# Patient Record
Sex: Female | Born: 1975 | Race: Black or African American | Hispanic: No | Marital: Married | Smoking: Never smoker
Health system: Southern US, Community
[De-identification: ages and names within clinical notes are randomized; demographics above are authoritative.]

## PROBLEM LIST (undated history)

## (undated) DIAGNOSIS — D649 Anemia, unspecified: Secondary | ICD-10-CM

## (undated) DIAGNOSIS — Z789 Other specified health status: Secondary | ICD-10-CM

## (undated) DIAGNOSIS — K219 Gastro-esophageal reflux disease without esophagitis: Secondary | ICD-10-CM

---

## 1997-05-06 HISTORY — PX: PILONIDAL CYST EXCISION: SHX744

## 1999-10-04 ENCOUNTER — Other Ambulatory Visit: Admission: RE | Admit: 1999-10-04 | Discharge: 1999-10-04 | Payer: Self-pay | Admitting: Obstetrics and Gynecology

## 2000-07-30 ENCOUNTER — Emergency Department (HOSPITAL_COMMUNITY): Admission: EM | Admit: 2000-07-30 | Discharge: 2000-07-30 | Payer: Self-pay | Admitting: *Deleted

## 2000-08-21 ENCOUNTER — Other Ambulatory Visit: Admission: RE | Admit: 2000-08-21 | Discharge: 2000-08-21 | Payer: Self-pay | Admitting: Obstetrics and Gynecology

## 2018-01-11 ENCOUNTER — Emergency Department (HOSPITAL_COMMUNITY): Payer: BLUE CROSS/BLUE SHIELD

## 2018-01-11 ENCOUNTER — Other Ambulatory Visit: Payer: Self-pay

## 2018-01-11 ENCOUNTER — Inpatient Hospital Stay (HOSPITAL_COMMUNITY)
Admission: EM | Admit: 2018-01-11 | Discharge: 2018-01-12 | DRG: 281 | Disposition: A | Discharge status: Home / Self Care | Payer: BLUE CROSS/BLUE SHIELD | Attending: Cardiology | Admitting: Cardiology

## 2018-01-11 ENCOUNTER — Encounter (HOSPITAL_COMMUNITY): Payer: Self-pay

## 2018-01-11 DIAGNOSIS — R55 Syncope and collapse: Secondary | ICD-10-CM

## 2018-01-11 DIAGNOSIS — G9389 Other specified disorders of brain: Secondary | ICD-10-CM | POA: Diagnosis not present

## 2018-01-11 DIAGNOSIS — I4510 Unspecified right bundle-branch block: Secondary | ICD-10-CM | POA: Diagnosis not present

## 2018-01-11 DIAGNOSIS — R0789 Other chest pain: Secondary | ICD-10-CM | POA: Diagnosis not present

## 2018-01-11 DIAGNOSIS — N920 Excessive and frequent menstruation with regular cycle: Secondary | ICD-10-CM | POA: Diagnosis present

## 2018-01-11 DIAGNOSIS — R072 Precordial pain: Secondary | ICD-10-CM | POA: Diagnosis not present

## 2018-01-11 DIAGNOSIS — D509 Iron deficiency anemia, unspecified: Secondary | ICD-10-CM | POA: Diagnosis not present

## 2018-01-11 DIAGNOSIS — G4489 Other headache syndrome: Secondary | ICD-10-CM | POA: Diagnosis not present

## 2018-01-11 DIAGNOSIS — R079 Chest pain, unspecified: Secondary | ICD-10-CM | POA: Diagnosis not present

## 2018-01-11 DIAGNOSIS — Z6841 Body Mass Index (BMI) 40.0 and over, adult: Secondary | ICD-10-CM | POA: Diagnosis not present

## 2018-01-11 DIAGNOSIS — I214 Non-ST elevation (NSTEMI) myocardial infarction: Secondary | ICD-10-CM | POA: Diagnosis not present

## 2018-01-11 DIAGNOSIS — I209 Angina pectoris, unspecified: Secondary | ICD-10-CM

## 2018-01-11 HISTORY — DX: Other specified health status: Z78.9

## 2018-01-11 HISTORY — DX: Angina pectoris, unspecified: I20.9

## 2018-01-11 LAB — COMPREHENSIVE METABOLIC PANEL
ALBUMIN: 3.4 g/dL — AB (ref 3.5–5.0)
ALT: 21 U/L (ref 0–44)
AST: 31 U/L (ref 15–41)
Alkaline Phosphatase: 61 U/L (ref 38–126)
Anion gap: 9 (ref 5–15)
BILIRUBIN TOTAL: 0.5 mg/dL (ref 0.3–1.2)
BUN: 10 mg/dL (ref 6–20)
CO2: 22 mmol/L (ref 22–32)
Calcium: 8.7 mg/dL — ABNORMAL LOW (ref 8.9–10.3)
Chloride: 107 mmol/L (ref 98–111)
Creatinine, Ser: 0.87 mg/dL (ref 0.44–1.00)
GFR calc Af Amer: >60 mL/min (ref >60)
GFR calc non Af Amer: >60 mL/min (ref >60)
Glucose, Bld: 128 mg/dL — ABNORMAL HIGH (ref 70–99)
POTASSIUM: 3.4 mmol/L — AB (ref 3.5–5.1)
Sodium: 138 mmol/L (ref 135–145)
TOTAL PROTEIN: 7 g/dL (ref 6.5–8.1)

## 2018-01-11 LAB — TROPONIN I: Troponin I: <0.03 ng/mL (ref <0.03)

## 2018-01-11 LAB — HEPARIN LEVEL (UNFRACTIONATED)
Heparin Unfractionated: <0.1 IU/mL — ABNORMAL LOW (ref 0.30–0.70)
Heparin Unfractionated: 0.12 IU/mL — ABNORMAL LOW (ref 0.30–0.70)

## 2018-01-11 LAB — CBC
HCT: 33.8 % — ABNORMAL LOW (ref 36.0–46.0)
Hemoglobin: 9.9 g/dL — ABNORMAL LOW (ref 12.0–15.0)
MCH: 23 pg — AB (ref 26.0–34.0)
MCHC: 29.3 g/dL — AB (ref 30.0–36.0)
MCV: 78.4 fL (ref 78.0–100.0)
PLATELETS: 262 10*3/uL (ref 150–400)
RBC: 4.31 MIL/uL (ref 3.87–5.11)
RDW: 14.7 % (ref 11.5–15.5)
WBC: 9.7 10*3/uL (ref 4.0–10.5)

## 2018-01-11 LAB — RAPID URINE DRUG SCREEN, HOSP PERFORMED
Amphetamines: NOT DETECTED
Barbiturates: NOT DETECTED
Benzodiazepines: NOT DETECTED
COCAINE: NOT DETECTED
Opiates: NOT DETECTED
Tetrahydrocannabinol: NOT DETECTED

## 2018-01-11 LAB — POC OCCULT BLOOD, ED: Fecal Occult Bld: NEGATIVE

## 2018-01-11 LAB — I-STAT TROPONIN, ED: Troponin i, poc: 0.13 ng/mL (ref 0.00–0.08)

## 2018-01-11 LAB — HEMOGLOBIN A1C
HEMOGLOBIN A1C: 5.8 % — AB (ref 4.8–5.6)
Mean Plasma Glucose: 119.76 mg/dL

## 2018-01-11 LAB — I-STAT BETA HCG BLOOD, ED (MC, WL, AP ONLY): I-stat hCG, quantitative: <5 m[IU]/mL (ref <5)

## 2018-01-11 MED ORDER — SODIUM CHLORIDE 0.9% FLUSH: 3.0000 mL | INTRAVENOUS | Status: DC | PRN

## 2018-01-11 MED ORDER — ROSUVASTATIN CALCIUM 20 MG PO TABS
20.0000 mg | ORAL_TABLET | Freq: Every day | ORAL | Status: DC
  Administered 2018-01-11: 20 mg via ORAL
  Filled 2018-01-11: qty 1

## 2018-01-11 MED ORDER — IOPAMIDOL (ISOVUE-370) INJECTION 76%
INTRAVENOUS | Status: AC
  Administered 2018-01-11: 100 mL
  Filled 2018-01-11: qty 100

## 2018-01-11 MED ORDER — HEPARIN BOLUS VIA INFUSION
2500.0000 [IU] | Freq: Once | INTRAVENOUS | Status: AC
  Administered 2018-01-11: 2500 [IU] via INTRAVENOUS
  Filled 2018-01-11: qty 2500

## 2018-01-11 MED ORDER — ASPIRIN EC 81 MG PO TBEC: 81.0000 mg | DELAYED_RELEASE_TABLET | Freq: Every day | ORAL | Status: DC

## 2018-01-11 MED ORDER — SODIUM CHLORIDE 0.9 % IV SOLN
INTRAVENOUS | Status: DC
  Administered 2018-01-11 – 2018-01-12 (×2): via INTRAVENOUS

## 2018-01-11 MED ORDER — ACETAMINOPHEN 325 MG PO TABS
650.0000 mg | ORAL_TABLET | ORAL | Status: DC | PRN
  Administered 2018-01-11: 650 mg via ORAL
  Filled 2018-01-11: qty 2

## 2018-01-11 MED ORDER — CLOPIDOGREL BISULFATE 75 MG PO TABS
75.0000 mg | ORAL_TABLET | Freq: Every day | ORAL | Status: DC
  Administered 2018-01-12: 75 mg via ORAL
  Filled 2018-01-11: qty 1

## 2018-01-11 MED ORDER — SODIUM CHLORIDE 0.9 % IV SOLN: 250.0000 mL | INTRAVENOUS | Status: DC | PRN

## 2018-01-11 MED ORDER — SODIUM CHLORIDE 0.9% FLUSH: 3.0000 mL | Freq: Two times a day (BID) | INTRAVENOUS | Status: DC

## 2018-01-11 MED ORDER — CLOPIDOGREL BISULFATE 75 MG PO TABS
600.0000 mg | ORAL_TABLET | Freq: Once | ORAL | Status: AC
  Administered 2018-01-11: 600 mg via ORAL
  Filled 2018-01-11: qty 8

## 2018-01-11 MED ORDER — HEPARIN (PORCINE) IN NACL 100-0.45 UNIT/ML-% IJ SOLN
1900.0000 [IU]/h | INTRAMUSCULAR | Status: DC
  Administered 2018-01-11: 1800 [IU]/h via INTRAVENOUS
  Administered 2018-01-11: 1250 [IU]/h via INTRAVENOUS
  Filled 2018-01-11 (×2): qty 250

## 2018-01-11 MED ORDER — ASPIRIN 81 MG PO CHEW
324.0000 mg | CHEWABLE_TABLET | ORAL | Status: DC
  Filled 2018-01-11: qty 4

## 2018-01-11 MED ORDER — ASPIRIN 300 MG RE SUPP: 300.0000 mg | RECTAL | Status: DC

## 2018-01-11 MED ORDER — ONDANSETRON HCL 4 MG/2ML IJ SOLN: 4.0000 mg | Freq: Four times a day (QID) | INTRAMUSCULAR | Status: DC | PRN

## 2018-01-11 MED ORDER — HEPARIN BOLUS VIA INFUSION
4000.0000 [IU] | Freq: Once | INTRAVENOUS | Status: AC
  Administered 2018-01-11: 4000 [IU] via INTRAVENOUS
  Filled 2018-01-11: qty 4000

## 2018-01-11 MED ORDER — NITROGLYCERIN 0.4 MG SL SUBL: 0.4000 mg | SUBLINGUAL_TABLET | SUBLINGUAL | Status: DC | PRN

## 2018-01-11 MED ORDER — METOPROLOL TARTRATE 12.5 MG HALF TABLET
12.5000 mg | ORAL_TABLET | Freq: Two times a day (BID) | ORAL | Status: DC
  Administered 2018-01-11 – 2018-01-12 (×2): 12.5 mg via ORAL
  Filled 2018-01-11 (×2): qty 1

## 2018-01-11 NOTE — Progress Notes (Signed)
ANTICOAGULATION CONSULT NOTE - Initial Consult  Pharmacy Consult for heparin Indication: chest pain/ACS  No Known Allergies  Patient Measurements: Height: 5\' 5"  (165.1 cm) Weight: 300 lb (136.1 kg) IBW/kg (Calculated) : 57 Heparin Dosing Weight: 90.7 kg  Vital Signs: Temp: 97.4 F (36.3 C) (09/08 0251) Temp Source: Oral (09/08 0251) BP: 129/80 (09/08 0530) Pulse Rate: 74 (09/08 0530)  Labs: Recent Labs    01/11/18 0318  HGB 9.9*  HCT 33.8*  PLT 262  CREATININE 0.87    Estimated Creatinine Clearance: 117.8 mL/min (by C-G formula based on SCr of 0.87 mg/dL).   Medical History: Past Medical History:  Diagnosis Date  . Known health problems: none     Medications:  See medication history  Assessment: 42 yo lady to start heparin for CP.  She was not on anticoagulation PTA.  Initial Hg low at 9.9, PTLC 262 Goal of Therapy:  Heparin level 0.3-0.7 units/ml Monitor platelets by anticoagulation protocol: Yes   Plan:  Heparin bolus 4000 units and drip at 1250 units/hr Check heparin level 6 hours after start Daily HL and CBC while on heparin  Nemesio Castrillon Poteet 01/11/2018,5:55 AM

## 2018-01-11 NOTE — Progress Notes (Signed)
[facility] for heparin Indication: chest pain/ACS  No Known Allergies  Patient Measurements: Height: 5\' 5"  (165.1 cm) Weight: 300 lb (136.1 kg) IBW/kg (Calculated) : 57 Heparin Dosing Weight: 90.7 kg  Vital Signs: Temp: 98.6 F (37 C) (09/08 1426) Temp Source: Oral (09/08 1426) BP: 123/86 (09/08 1426) Pulse Rate: 79 (09/08 1426)  Labs: Recent Labs    01/11/18 0318 01/11/18 1201 01/11/18 1356  HGB 9.9*  --   --   HCT 33.8*  --   --   PLT 262  --   --   HEPARINUNFRC  --   --  <0.10*  CREATININE 0.87  --   --   TROPONINI  --  <0.03  --     Estimated Creatinine Clearance: 117.8 mL/min (by C-G formula based on SCr of 0.87 mg/dL).   Medical History: Past Medical History:  Diagnosis Date  . Known health problems: none     Medications:  See medication history  Assessment: 42 yo lady to start heparin for CP.  She was not on anticoagulation PTA. Hg low at 9.9, PTLC 262  Initial heparin level undetectable, no overt bleeding or infusion issues reported   Goal of Therapy:  Heparin level 0.3-0.7 units/ml Monitor platelets by anticoagulation protocol: Yes   Plan:  Bolus heparin 2500 units  Increase heparin gtt to 1500 units/hr Check heparin level 6 hours after start Daily HL and CBC while on heparin  Marissa Flores Aleister Lady 01/11/2018,2:39 PM

## 2018-01-11 NOTE — ED Notes (Addendum)
Patient transported to CT/xray 

## 2018-01-11 NOTE — ED Provider Notes (Signed)
[facility] EMERGENCY DEPARTMENT Provider Note  CSN: [id number] Arrival date & time: 01/11/18 0093  Chief Complaint(s) Chest Pain and Loss of Consciousness  HPI Marissa Flores is a 42 y.o. female with a history of obesity presents to the emergency department with sudden onset of severe sharp precordial chest pain that wrapped around the entire torso approximately 1-1/2 hours ago.  Pain was at rest.  No alleviating or aggravating factors.  Resolve spontaneously within 30 minutes.  This was associated with shortness of breath.  Also with syncopal episode resulting and possible head trauma.  Patient reports that she was sleeping when the pain began.  She sat up on the side of the bed and asked her son to bring her glass of milk.  After drinking the milk patient reports waking up on the floor.  States that she hit her head or shoulder on the wall creating a hole in the wall.  Currently she denies any chest pain or shortness of breath.  She denies any prior cardiac history.  Denies any prior DVT/PEs.  Denies any recent travel, personal history of cancer, OCP or hormone replacement therapy.  Endorses mild headache.  No neck pain, back pain, shoulder pain.  Denies any other physical injuries.  HPI  Past Medical History Past Medical History:  Diagnosis Date  . Known health problems: none    There are no active problems to display for this patient.  Home Medication(s) Prior to Admission medications   Medication Sig Start Date End Date Taking? Authorizing Provider  ibuprofen (ADVIL,MOTRIN) 200 MG tablet Take 600-800 mg by mouth every 6 (six) hours as needed for mild pain.   Yes [provider]                                                                                                                                    Past Surgical History History reviewed. No pertinent surgical history. Family History History reviewed. No pertinent family history.  Social  History Social History   Tobacco Use  . Smoking status: Never Smoker  . Smokeless tobacco: Never Used  Substance Use Topics  . Alcohol use: Yes    Comment: socially  . Drug use: Never   Allergies Patient has no known allergies.  Review of Systems Review of Systems All other systems are reviewed and are negative for acute change except as noted in the HPI  Physical Exam Vital Signs  I have reviewed the triage vital signs BP 116/70 (BP Location: Left Arm)   Pulse 85   Temp (!) 97.4 F (36.3 C) (Oral)   Resp 20   Ht 5\' 5"  (1.651 m)   Wt 136.1 kg   LMP 01/04/2018   SpO2 100%   BMI 49.92 kg/m   Physical Exam  Constitutional: She is oriented to person, place, and time. She appears well-developed and well-nourished. No distress.  Morbidly obese  HENT:  Head:  Normocephalic and atraumatic.  Right Ear: External ear normal.  Left Ear: External ear normal.  Nose: Nose normal.  Eyes: Pupils are equal, round, and reactive to light. Conjunctivae and EOM are normal. Right eye exhibits no discharge. Left eye exhibits no discharge. No scleral icterus.  Neck: Normal range of motion. Neck supple.  Cardiovascular: Normal rate, regular rhythm and normal heart sounds. Exam reveals no gallop and no friction rub.  No murmur heard. Pulses:      Radial pulses are 2+ on the right side, and 2+ on the left side.       Dorsalis pedis pulses are 2+ on the right side, and 2+ on the left side.  Pulmonary/Chest: Effort normal and breath sounds normal. No stridor. No respiratory distress. She has no wheezes. She has no rales.  Abdominal: Soft. She exhibits no distension. There is no tenderness.  Musculoskeletal: She exhibits no edema or tenderness.       Cervical back: She exhibits no bony tenderness.       Thoracic back: She exhibits no bony tenderness.       Lumbar back: She exhibits no bony tenderness.  Clavicles stable. Chest stable to AP/Lat compression. Pelvis stable to Lat compression. No  obvious extremity deformity. No chest or abdominal wall contusion.  Neurological: She is alert and oriented to person, place, and time.  Moving all extremities  Skin: Skin is warm and dry. No rash noted. She is not diaphoretic. No erythema.  Psychiatric: She has a normal mood and affect.  Vitals reviewed.   ED Results and Treatments Labs (all labs ordered are listed, but only abnormal results are displayed) Labs Reviewed  CBC - Abnormal; Notable for the following components:      Result Value   Hemoglobin 9.9 (*)    HCT 33.8 (*)    MCH 23.0 (*)    MCHC 29.3 (*)    All other components within normal limits  COMPREHENSIVE METABOLIC PANEL - Abnormal; Notable for the following components:   Potassium 3.4 (*)    Glucose, Bld 128 (*)    Calcium 8.7 (*)    Albumin 3.4 (*)    All other components within normal limits  I-STAT TROPONIN, ED - Abnormal; Notable for the following components:   Troponin i, poc 0.13 (*)    All other components within normal limits  HEPARIN LEVEL (UNFRACTIONATED)  RAPID URINE DRUG SCREEN, HOSP PERFORMED  I-STAT BETA HCG BLOOD, ED ([facility], [facility], [facility] ONLY)  POC OCCULT BLOOD, ED                                                                                                                         EKG  EKG Interpretation  Date/Time:  Sunday January 11 2018 03:02:04 EDT Ventricular Rate:  86 PR Interval:    QRS Duration: 146 QT Interval:  400 QTC Calculation: 479 R Axis:   45 Text Interpretation:  Sinus rhythm Right bundle branch block NO STEMI No old  tracing to compare Confirmed by Addison Lank [phone]) on 01/11/2018 3:08:22 AM      Radiology Dg Chest 2 View  Result Date: 01/11/2018 CLINICAL DATA:  Chest pain EXAM: CHEST - 2 VIEW COMPARISON:  None. FINDINGS: The heart size and mediastinal contours are within normal limits. Both lungs are clear. The visualized skeletal structures are unremarkable. IMPRESSION: No active cardiopulmonary disease. Electronically  Signed   By: Ulyses Jarred M.D.   On: 01/11/2018 04:22   Ct Head Wo Contrast  Result Date: 01/11/2018 CLINICAL DATA:  Patient woke at 0130 hours with severe chest pain and then passed out. EXAM: CT HEAD WITHOUT CONTRAST TECHNIQUE: Contiguous axial images were obtained from the base of the skull through the vertex without intravenous contrast. COMPARISON:  None. FINDINGS: Brain: No evidence of acute infarction, hemorrhage, hydrocephalus, extra-axial collection or mass lesion/mass effect. Focal basal ganglia calcification on the left. Vascular: No hyperdense vessel or unexpected calcification. Skull: Normal. Negative for fracture or focal lesion. Sinuses/Orbits: No acute finding. Other: None. IMPRESSION: No acute intracranial abnormality. Electronically Signed   By: Lucienne Capers M.D.   On: 01/11/2018 05:08   Ct Angio Chest Pe W And/or Wo Contrast  Result Date: 01/11/2018 CLINICAL DATA:  Patient woke at 1:30 a.m. with severe chest pain and then passed out. EXAM: CT ANGIOGRAPHY CHEST WITH CONTRAST TECHNIQUE: Multidetector CT imaging of the chest was performed using the standard protocol during bolus administration of intravenous contrast. Multiplanar CT image reconstructions and MIPs were obtained to evaluate the vascular anatomy. CONTRAST:  149mL ISOVUE-370 IOPAMIDOL (ISOVUE-370) INJECTION 76% COMPARISON:  None. FINDINGS: Cardiovascular: Moderately good opacification of the central and segmental pulmonary arteries. No focal filling defects. No evidence of significant pulmonary embolus. Normal heart size. No pericardial effusion. Normal caliber thoracic aorta. Great vessel origins are patent. Mediastinum/Nodes: No significant lymphadenopathy in the chest. Esophagus is decompressed. Lungs/Pleura: Motion artifact limits evaluation. Lungs appear clear and expanded. No pleural effusions. No pneumothorax. Airways are patent. Upper Abdomen: No acute abnormalities identified. Musculoskeletal: No chest wall  abnormality. No acute or significant osseous findings. Review of the MIP images confirms the above findings. IMPRESSION: No evidence of significant pulmonary embolus. No evidence of active pulmonary disease. Electronically Signed   By: Lucienne Capers M.D.   On: 01/11/2018 05:10   Pertinent labs & imaging results that were available during my care of the patient were reviewed by me and considered in my medical decision making (see chart for details).  Medications Ordered in ED Medications  heparin ADULT infusion 100 units/mL (25000 units/228mL sodium chloride 0.45%) (1,250 Units/hr Intravenous New Bag/Given 01/11/18 0645)  iopamidol (ISOVUE-370) 76 % injection (100 mLs  Contrast Given 01/11/18 0437)  heparin bolus via infusion 4,000 Units (4,000 Units Intravenous Bolus from Bag 01/11/18 0645)  Procedures Procedures CRITICAL CARE Performed by: Grayce Sessions Wentworth Edelen Total critical care time: 55 minutes Critical care time was exclusive of separately billable procedures and treating other patients. Critical care was necessary to treat or prevent imminent or life-threatening deterioration. Critical care was time spent personally by me on the following activities: development of treatment plan with patient and/or surrogate as well as nursing, discussions with consultants, evaluation of patient's response to treatment, examination of patient, obtaining history from patient or surrogate, ordering and performing treatments and interventions, ordering and review of laboratory studies, ordering and review of radiographic studies, pulse oximetry and re-evaluation of patient's condition.   (including critical care time)  Medical Decision Making / ED Course I have reviewed the nursing notes for this encounter and the patient's prior records (if available in EHR or on provided  paperwork).    Chest pain with shortness of breath and syncopal episode.  EKG with right bundle branch block otherwise no ischemic changes.  No prior EKG for comparison.  Initial troponin +0.13.  Presentation initially was most concerning for pulmonary embolism however CTA was negative for PE.  Rest of the labs without leukocytosis.  Did reveal anemia with a hemoglobin of 9.9.  Patient denied any knowledge of anemia but they report that she has heavy menstrual cycles.  She denies any hematochezia or melena.  No significant electrolyte derangements or renal insufficiency.  Started on heparin bolus and drip.  Currently chest pain-free.  Spoke with Dr. Virgina Jock from cardiology who will admit the patient for continued work-up and management.  Final Clinical Impression(s) / ED Diagnoses Final diagnoses:  NSTEMI (non-ST elevated myocardial infarction) ([place])  Precordial pain  Syncope and collapse      This chart was dictated using voice recognition software.  Despite best efforts to proofread,  errors can occur which can change the documentation meaning.   Fatima Blank, MD 01/11/18 [phone]

## 2018-01-11 NOTE — ED Notes (Signed)
Ordered lunch tray for pt  

## 2018-01-11 NOTE — H&P (Signed)
Marissa Flores is an 42 y.o. female.   Chief Complaint: Chest pain, syncope HPI: Troponin elevation  42 year old African-American female with menorrhagia, morbid obesity, history of preeclampsia and gestational hypertension, no other significant medical comorbidities.   The patient was home with her 2 kids. On 01/12/2019 1940 morning, she woke up from sleep with severe retrosternal chest pain radiating around her chest. Pain was associated with shortness of breath and lightheadedness. Patient members getting out of bed and then lost consciousness, for less than a minute according to her 47 y/o son. Pain spontaneously resolved in 30 minutes. Patient was brought to the emergency room. CT angiogram was performed that ruled out pulmonary medicine. Her EKG showed sinus rhythm and right bundle branch block. Troponin was mildly elevated at 0.13. Cardiology was thus consulted.  She reports having similar episode of self limiting chest pain one month prior that she self diagnosed as acid reflux and treated herself with PPI with some improvement. She has not had any other syncopal episodes.  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 She is active at work and with kids, she does not do any regular exercise. She has had menorrhagia with heavy menstrual bleeding lasting for 6 days every cycle, for last one year. Workup in the emergency room shows microcytic anemia with hemoglobin of 9.9. She has not sought any medical attention for this and does not have a primary care doctor.  Patient's mother reports having had an MI in her 66s. He is currently alive and 42 years old. Further history provided by the patient, patient's father had an aneurysm behind his optic nerve at age 49, requiring surgery followed by a stroke, and died two years as later. Patient verified the history with her mother over the phone and confirmed that patient's father had uncontrolled hypertension and was noncompliant with  medications. Patient does not report any daily headaches, vision changes, stroke/TIA symptoms.  Past Medical History:  Diagnosis Date  . Known health problems: none     History reviewed. No pertinent surgical history.  Family History  Problem Relation Age of Onset  . Heart attack Mother 71       Mid 63s  . Aneurysm Father 71       Brain aneurysm behind optic nerve?  . Stroke Father    Social History:  reports that she has never smoked. She has never used smokeless tobacco. She reports that she drinks alcohol. She reports that she does not use drugs.  Allergies: No Known Allergies   (Not in a hospital admission)  Results for orders placed or performed during the hospital encounter of 01/11/18 (from the past 48 hour(s))  CBC     Status: Abnormal   Collection Time: 01/11/18  3:18 AM  Result Value Ref Range   WBC 9.7 4.0 - 10.5 K/uL   RBC 4.31 3.87 - 5.11 MIL/uL   Hemoglobin 9.9 (L) 12.0 - 15.0 g/dL   HCT 33.8 (L) 36.0 - 46.0 %   MCV 78.4 78.0 - 100.0 fL   MCH 23.0 (L) 26.0 - 34.0 pg   MCHC 29.3 (L) 30.0 - 36.0 g/dL   RDW 14.7 11.5 - 15.5 %   Platelets 262 150 - 400 K/uL    Comment: Performed at [facility], [place] [address] [place],  [address]  Comprehensive metabolic panel     Status: Abnormal   Collection Time: 01/11/18  3:18 AM  Result Value Ref Range   Sodium 138  135 - 145 mmol/L   Potassium 3.4 (L) 3.5 - 5.1 mmol/L   Chloride 107 98 - 111 mmol/L   CO2 22 22 - 32 mmol/L   Glucose, Bld 128 (H) 70 - 99 mg/dL   BUN 10 6 - 20 mg/dL   Creatinine, Ser 0.87 0.44 - 1.00 mg/dL   Calcium 8.7 (L) 8.9 - 10.3 mg/dL   Total Protein 7.0 6.5 - 8.1 g/dL   Albumin 3.4 (L) 3.5 - 5.0 g/dL   AST 31 15 - 41 U/L   ALT 21 0 - 44 U/L   Alkaline Phosphatase 61 38 - 126 U/L   Total Bilirubin 0.5 0.3 - 1.2 mg/dL   GFR calc non Af Amer >60 >60 mL/min   GFR calc Af Amer >60 >60 mL/min    Comment: (NOTE) The eGFR has been calculated using the CKD EPI equation. This  calculation has not been validated in all clinical situations. eGFR's persistently <60 mL/min signify possible Chronic Kidney Disease.    Anion gap 9 5 - 15    Comment: Performed at [facility] [address], [place], [place] [address]  I-Stat beta hCG blood, ED     Status: None   Collection Time: 01/11/18  3:40 AM  Result Value Ref Range   I-stat hCG, quantitative <5.0 <5 mIU/mL   Comment 3            Comment:   GEST. AGE      CONC.  (mIU/mL)   <=1 WEEK        5 - 50     2 WEEKS       50 - 500     3 WEEKS       100 - 10,000     4 WEEKS     1,000 - 30,000        FEMALE AND NON-PREGNANT FEMALE:     LESS THAN 5 mIU/mL   I-stat troponin, ED     Status: Abnormal   Collection Time: 01/11/18  3:41 AM  Result Value Ref Range   Troponin i, poc 0.13 (HH) 0.00 - 0.08 ng/mL   Comment NOTIFIED PHYSICIAN    Comment 3            Comment: Due to the release kinetics of cTnI, a negative result within the first hours of the onset of symptoms does not rule out myocardial infarction with certainty. If myocardial infarction is still suspected, repeat the test at appropriate intervals.   POC occult blood, ED     Status: None   Collection Time: 01/11/18  7:00 AM  Result Value Ref Range   Fecal Occult Bld NEGATIVE NEGATIVE   Dg Chest 2 View  Result Date: 01/11/2018 CLINICAL DATA:  Chest pain EXAM: CHEST - 2 VIEW COMPARISON:  None. FINDINGS: The heart size and mediastinal contours are within normal limits. Both lungs are clear. The visualized skeletal structures are unremarkable. IMPRESSION: No active cardiopulmonary disease. Electronically Signed   By: Ulyses Jarred M.D.   On: 01/11/2018 04:22   Ct Head Wo Contrast  Result Date: 01/11/2018 CLINICAL DATA:  Patient woke at 0130 hours with severe chest pain and then passed out. EXAM: CT HEAD WITHOUT CONTRAST TECHNIQUE: Contiguous axial images were obtained from the base of the skull through the vertex without intravenous contrast. COMPARISON:   None. FINDINGS: Brain: No evidence of acute infarction, hemorrhage, hydrocephalus, extra-axial collection or mass lesion/mass effect. Focal basal ganglia calcification on the left.  Vascular: No hyperdense vessel or unexpected calcification. Skull: Normal. Negative for fracture or focal lesion. Sinuses/Orbits: No acute finding. Other: None. IMPRESSION: No acute intracranial abnormality. Electronically Signed   By: Lucienne Capers M.D.   On: 01/11/2018 05:08   Ct Angio Chest Pe W And/or Wo Contrast  Result Date: 01/11/2018 CLINICAL DATA:  Patient woke at 1:30 a.m. with severe chest pain and then passed out. EXAM: CT ANGIOGRAPHY CHEST WITH CONTRAST TECHNIQUE: Multidetector CT imaging of the chest was performed using the standard protocol during bolus administration of intravenous contrast. Multiplanar CT image reconstructions and MIPs were obtained to evaluate the vascular anatomy. CONTRAST:  155m ISOVUE-370 IOPAMIDOL (ISOVUE-370) INJECTION 76% COMPARISON:  None. FINDINGS: Cardiovascular: Moderately good opacification of the central and segmental pulmonary arteries. No focal filling defects. No evidence of significant pulmonary embolus. Normal heart size. No pericardial effusion. Normal caliber thoracic aorta. Great vessel origins are patent. Mediastinum/Nodes: No significant lymphadenopathy in the chest. Esophagus is decompressed. Lungs/Pleura: Motion artifact limits evaluation. Lungs appear clear and expanded. No pleural effusions. No pneumothorax. Airways are patent. Upper Abdomen: No acute abnormalities identified. Musculoskeletal: No chest wall abnormality. No acute or significant osseous findings. Review of the MIP images confirms the above findings. IMPRESSION: No evidence of significant pulmonary embolus. No evidence of active pulmonary disease. Electronically Signed   By: WLucienne CapersM.D.   On: 01/11/2018 05:10    Review of Systems  Constitutional: Negative for chills, fever and malaise/fatigue.   HENT: Negative.   Respiratory: Positive for shortness of breath (Now resolved). Negative for cough.   Cardiovascular: Positive for chest pain (now resolved). Negative for palpitations, leg swelling and PND.  Gastrointestinal: Negative for abdominal pain, nausea and vomiting.  Genitourinary:       Menorrhagia  Musculoskeletal: Negative.   Skin: Negative.   Neurological: Positive for loss of consciousness (associated with episode of chest pain). Negative for dizziness, weakness and headaches.  Endo/Heme/Allergies: Does not bruise/bleed easily (no other bleeding except for h/o menorrhagia).  Psychiatric/Behavioral: Negative.  Nervous/anxious:    All other systems reviewed and are negative.   Blood pressure (!) 109/94, pulse 88, temperature (!) 97.4 F (36.3 C), temperature source Oral, resp. rate 13, height '5\' 5"'  (1.651 m), weight 136.1 kg, last menstrual period 01/04/2018, SpO2 95 %. Physical Exam  Nursing note and vitals reviewed. Constitutional: She is oriented to person, place, and time. She appears well-developed and well-nourished. No distress.  Morbid obesity  HENT:  Head: Normocephalic and atraumatic.  Eyes: Pupils are equal, round, and reactive to light. Conjunctivae are normal.  Neck: Normal range of motion. Neck supple. No JVD present.  Cardiovascular: Normal rate, regular rhythm and normal heart sounds.  No murmur heard. Respiratory: Effort normal and breath sounds normal. She has no wheezes. She has no rales.  GI: Soft. Bowel sounds are normal. There is no tenderness. There is no rebound.  Musculoskeletal: She exhibits no edema.  Lymphadenopathy:    She has no cervical adenopathy.  Neurological: She is alert and oriented to person, place, and time. No cranial nerve deficit.  Skin: Skin is warm and dry.  Psychiatric: She has a normal mood and affect.     Assessment/Plan  42year old African-American female with morbid obesity, history of menorrhagia, history of  preeclampsia and gestational hypertension, now admitted with episode of chest pain and syncope, troponin elevation suggesting NSTEMI.  NSTEMI: Currently chest pain-free. Hemodynamically stable. Troponin 0.13. No CTA or acute aortic syndrome on CT angiogram. Admit to telemetry. Follow with  Plavix 600 mg. Continue ACS dose heparin. Start Crestor 20 mg, metoprolol 12.5 mg twice daily. We'll obtain echocardiogram.  I discussed the risks and benefits of cardiac catheterization and dual antiplatelet therapy with the patient.   Microcytic anemia, menorrhagia: Patient has a year long h/o menorrhagia with microcytic anemia. This will likely need attention in near future. To reduce her bleeding risk, I recommend Plavix instead of Brilinta or Effient. Should she have obstructive coronary artery disease, after successful revascularization, it may be reasonable to stop dual antiplatelet therapy in 3 months if she requires any surgical intervention for her menorrhagia. I have encouraged her to establish primary care and OB/GYN care for management of her menorrhagia.  FH/o ?brain aneurysm: No symptoms of regular headache, stroke/TIA symptoms. It may be reasonable to proceed with DAPT for her NSTEMI.  Nigel Mormon, MD 01/11/2018, 9:00 AM  [place], MD [facility] Pager: [phone] Office: [phone] If no answer Cell [phone]

## 2018-01-11 NOTE — Progress Notes (Signed)
ANTICOAGULATION CONSULT NOTE   Pharmacy Consult for heparin Indication: chest pain/ACS  No Known Allergies  Patient Measurements: Height: 5\' 5"  (165.1 cm) Weight: 300 lb (136.1 kg) IBW/kg (Calculated) : 57 Heparin Dosing Weight: 90.7 kg  Vital Signs: Temp: 98.1 F (36.7 C) (09/08 2003) Temp Source: Oral (09/08 2003) BP: 102/54 (09/08 2003) Pulse Rate: 84 (09/08 2003)  Labs: Recent Labs    01/11/18 0318 01/11/18 1201 01/11/18 1356 01/11/18 2055  HGB 9.9*  --   --   --   HCT 33.8*  --   --   --   PLT 262  --   --   --   HEPARINUNFRC  --   --  <0.10* 0.12*  CREATININE 0.87  --   --   --   TROPONINI  --  <0.03  --   --     Estimated Creatinine Clearance: 117.8 mL/min (by C-G formula based on SCr of 0.87 mg/dL).   Medical History: Past Medical History:  Diagnosis Date  . Known health problems: none     Medications:  See medication history  Assessment: 42 yo lady to start heparin for CP.  She was not on anticoagulation PTA. Hg low at 9.9, PTLC 262  Initial heparin level undetectable, no overt bleeding or infusion issues reported.   Repeat heparin level 0.12  Goal of Therapy:  Heparin level 0.3-0.7 units/ml Monitor platelets by anticoagulation protocol: Yes   Plan:  Bolus heparin 2500 units  Increase heparin gtt to 1800 units/hr Check heparin level 6 hours  Daily HL and CBC while on heparin  Shynia Daleo A. Levada Dy, PharmD, [place] Pager: [phone] Please utilize Amion for appropriate phone number to reach the unit pharmacist ([place])   01/11/2018,9:41 PM

## 2018-01-11 NOTE — Plan of Care (Signed)
Admitted for Chest Pain/ NSTEMI. VSS. Pain Contolled. Plan Cardiac Cath in AM.

## 2018-01-11 NOTE — ED Triage Notes (Addendum)
Patient awoke at 01:30ish with severe CP. Attempted to drink some milk to relieve what she believed to be heartburn when she passed out (witnessed by family). Patient was given 324 asa by EMS.

## 2018-01-12 ENCOUNTER — Inpatient Hospital Stay (HOSPITAL_COMMUNITY): Payer: BLUE CROSS/BLUE SHIELD

## 2018-01-12 ENCOUNTER — Encounter (HOSPITAL_COMMUNITY): Payer: Self-pay | Admitting: Cardiology

## 2018-01-12 ENCOUNTER — Encounter (HOSPITAL_COMMUNITY)
Admission: EM | Disposition: A | Discharge status: Home / Self Care | Payer: Self-pay | Source: Home / Self Care | Attending: Cardiology

## 2018-01-12 HISTORY — PX: LEFT HEART CATH AND CORONARY ANGIOGRAPHY: CATH118249

## 2018-01-12 LAB — CBC WITH DIFFERENTIAL/PLATELET
Abs Immature Granulocytes: 0 10*3/uL (ref 0.0–0.1)
Basophils Absolute: 0.1 10*3/uL (ref 0.0–0.1)
Basophils Relative: 1 %
EOS PCT: 1 %
Eosinophils Absolute: 0.1 10*3/uL (ref 0.0–0.7)
HCT: 32.5 % — ABNORMAL LOW (ref 36.0–46.0)
HEMOGLOBIN: 9.5 g/dL — AB (ref 12.0–15.0)
Immature Granulocytes: 0 %
LYMPHS ABS: 2.2 10*3/uL (ref 0.7–4.0)
LYMPHS PCT: 29 %
MCH: 22.8 pg — AB (ref 26.0–34.0)
MCHC: 29.2 g/dL — AB (ref 30.0–36.0)
MCV: 77.9 fL — ABNORMAL LOW (ref 78.0–100.0)
MONO ABS: 0.6 10*3/uL (ref 0.1–1.0)
Monocytes Relative: 7 %
Neutro Abs: 4.6 10*3/uL (ref 1.7–7.7)
Neutrophils Relative %: 62 %
Platelets: 252 10*3/uL (ref 150–400)
RBC: 4.17 MIL/uL (ref 3.87–5.11)
RDW: 14.8 % (ref 11.5–15.5)
WBC: 7.6 10*3/uL (ref 4.0–10.5)

## 2018-01-12 LAB — LIPID PANEL
Cholesterol: 157 mg/dL (ref 0–200)
HDL: 29 mg/dL — ABNORMAL LOW (ref >40)
LDL Cholesterol: 100 mg/dL — ABNORMAL HIGH (ref 0–99)
TRIGLYCERIDES: 139 mg/dL (ref <150)
Total CHOL/HDL Ratio: 5.4 RATIO
VLDL: 28 mg/dL (ref 0–40)

## 2018-01-12 LAB — BASIC METABOLIC PANEL
Anion gap: 8 (ref 5–15)
BUN: 10 mg/dL (ref 6–20)
CHLORIDE: 108 mmol/L (ref 98–111)
CO2: 23 mmol/L (ref 22–32)
Calcium: 8.7 mg/dL — ABNORMAL LOW (ref 8.9–10.3)
Creatinine, Ser: 1.04 mg/dL — ABNORMAL HIGH (ref 0.44–1.00)
GFR calc Af Amer: >60 mL/min (ref >60)
GFR calc non Af Amer: >60 mL/min (ref >60)
GLUCOSE: 115 mg/dL — AB (ref 70–99)
POTASSIUM: 3.6 mmol/L (ref 3.5–5.1)
Sodium: 139 mmol/L (ref 135–145)

## 2018-01-12 LAB — HIV ANTIBODY (ROUTINE TESTING W REFLEX): HIV Screen 4th Generation wRfx: NONREACTIVE

## 2018-01-12 LAB — ECHOCARDIOGRAM COMPLETE
Height: 65 in
Weight: 4797.21 oz

## 2018-01-12 LAB — HEPARIN LEVEL (UNFRACTIONATED): Heparin Unfractionated: 0.29 IU/mL — ABNORMAL LOW (ref 0.30–0.70)

## 2018-01-12 SURGERY — LEFT HEART CATH AND CORONARY ANGIOGRAPHY
Anesthesia: LOCAL

## 2018-01-12 MED ORDER — MIDAZOLAM HCL 2 MG/2ML IJ SOLN
INTRAMUSCULAR | Status: AC
  Filled 2018-01-12: qty 2

## 2018-01-12 MED ORDER — LIDOCAINE HCL (PF) 1 % IJ SOLN
INTRAMUSCULAR | Status: DC | PRN
  Administered 2018-01-12: 2 mL

## 2018-01-12 MED ORDER — VERAPAMIL HCL 2.5 MG/ML IV SOLN
INTRAVENOUS | Status: AC
  Filled 2018-01-12: qty 2

## 2018-01-12 MED ORDER — VERAPAMIL HCL 2.5 MG/ML IV SOLN
INTRAVENOUS | Status: DC | PRN
  Administered 2018-01-12: 10 mL via INTRA_ARTERIAL

## 2018-01-12 MED ORDER — LIDOCAINE HCL (PF) 1 % IJ SOLN
INTRAMUSCULAR | Status: AC
  Filled 2018-01-12: qty 30

## 2018-01-12 MED ORDER — AMLODIPINE BESYLATE 5 MG PO TABS
5.0000 mg | ORAL_TABLET | Freq: Every day | ORAL | Status: DC
  Administered 2018-01-12: 5 mg via ORAL
  Filled 2018-01-12: qty 1

## 2018-01-12 MED ORDER — FENTANYL CITRATE (PF) 100 MCG/2ML IJ SOLN
INTRAMUSCULAR | Status: AC
  Filled 2018-01-12: qty 2

## 2018-01-12 MED ORDER — HEPARIN SODIUM (PORCINE) 1000 UNIT/ML IJ SOLN
INTRAMUSCULAR | Status: AC
  Filled 2018-01-12: qty 1

## 2018-01-12 MED ORDER — ASPIRIN 81 MG PO CHEW
CHEWABLE_TABLET | ORAL | Status: AC
  Filled 2018-01-12: qty 1

## 2018-01-12 MED ORDER — SODIUM CHLORIDE 0.9 % IV SOLN: 250.0000 mL | INTRAVENOUS | Status: DC | PRN

## 2018-01-12 MED ORDER — HEPARIN (PORCINE) IN NACL 1000-0.9 UT/500ML-% IV SOLN
INTRAVENOUS | Status: AC
  Filled 2018-01-12: qty 1000

## 2018-01-12 MED ORDER — HEPARIN (PORCINE) IN NACL 1000-0.9 UT/500ML-% IV SOLN
INTRAVENOUS | Status: DC | PRN
  Administered 2018-01-12 (×2): 500 mL

## 2018-01-12 MED ORDER — AMLODIPINE BESYLATE 5 MG PO TABS: 5.0000 mg | ORAL_TABLET | Freq: Every day | ORAL | 3 refills | Status: DC

## 2018-01-12 MED ORDER — ONDANSETRON HCL 4 MG/2ML IJ SOLN: 4.0000 mg | Freq: Four times a day (QID) | INTRAMUSCULAR | Status: DC | PRN

## 2018-01-12 MED ORDER — SODIUM CHLORIDE 0.9 % IV SOLN: INTRAVENOUS | Status: AC

## 2018-01-12 MED ORDER — ACETAMINOPHEN 325 MG PO TABS: 650.0000 mg | ORAL_TABLET | ORAL | Status: DC | PRN

## 2018-01-12 MED ORDER — ROSUVASTATIN CALCIUM 10 MG PO TABS: 10.0000 mg | ORAL_TABLET | Freq: Every day | ORAL | 3 refills | Status: DC

## 2018-01-12 MED ORDER — IOHEXOL 350 MG/ML SOLN
INTRAVENOUS | Status: DC | PRN
  Administered 2018-01-12: 90 mL via INTRA_ARTERIAL

## 2018-01-12 MED ORDER — FENTANYL CITRATE (PF) 100 MCG/2ML IJ SOLN
INTRAMUSCULAR | Status: DC | PRN
  Administered 2018-01-12: 50 ug via INTRAVENOUS

## 2018-01-12 MED ORDER — SODIUM CHLORIDE 0.9% FLUSH: 3.0000 mL | INTRAVENOUS | Status: DC | PRN

## 2018-01-12 MED ORDER — MIDAZOLAM HCL 2 MG/2ML IJ SOLN
INTRAMUSCULAR | Status: DC | PRN
  Administered 2018-01-12: 1 mg via INTRAVENOUS

## 2018-01-12 MED ORDER — SODIUM CHLORIDE 0.9% FLUSH: 3.0000 mL | Freq: Two times a day (BID) | INTRAVENOUS | Status: DC

## 2018-01-12 MED ORDER — HEPARIN SODIUM (PORCINE) 1000 UNIT/ML IJ SOLN
INTRAMUSCULAR | Status: DC | PRN
  Administered 2018-01-12: 6000 [IU] via INTRAVENOUS

## 2018-01-12 MED ORDER — ASPIRIN 81 MG PO CHEW
81.0000 mg | CHEWABLE_TABLET | Freq: Once | ORAL | Status: AC
  Administered 2018-01-12: 81 mg via ORAL
  Filled 2018-01-12: qty 1

## 2018-01-12 SURGICAL SUPPLY — 12 items
CATH 5FR JL3.5 JR4 ANG PIG MP (CATHETERS) ×1 IMPLANT
CATH LAUNCHER 5F JL3 (CATHETERS) IMPLANT
CATHETER LAUNCHER 5F JL3 (CATHETERS) ×2
DEVICE RAD COMP TR BAND LRG (VASCULAR PRODUCTS) ×1 IMPLANT
GLIDESHEATH SLEND A-KIT 6F 22G (SHEATH) ×1 IMPLANT
GUIDEWIRE INQWIRE 1.5J.035X260 (WIRE) IMPLANT
INQWIRE 1.5J .035X260CM (WIRE) ×2
KIT HEART LEFT (KITS) ×2 IMPLANT
PACK CARDIAC CATHETERIZATION (CUSTOM PROCEDURE TRAY) ×2 IMPLANT
SYR MEDRAD MARK V 150ML (SYRINGE) ×2 IMPLANT
TRANSDUCER W/STOPCOCK (MISCELLANEOUS) ×2 IMPLANT
TUBING CIL FLEX 10 FLL-RA (TUBING) ×2 IMPLANT

## 2018-01-12 NOTE — Progress Notes (Signed)
ANTICOAGULATION CONSULT NOTE   Pharmacy Consult for heparin Indication: chest pain/ACS  No Known Allergies  Patient Measurements: Height: 5\' 5"  (165.1 cm) Weight: 299 lb 13.2 oz (136 kg) IBW/kg (Calculated) : 57 Heparin Dosing Weight: 90.7 kg  Vital Signs: Temp: 98.1 F (36.7 C) (09/08 2003) Temp Source: Oral (09/08 2003) BP: 102/54 (09/08 2003) Pulse Rate: 84 (09/08 2003)  Labs: Recent Labs    01/11/18 0318 01/11/18 1201 01/11/18 1356 01/11/18 2055 01/12/18 0421  HGB 9.9*  --   --   --  9.5*  HCT 33.8*  --   --   --  32.5*  PLT 262  --   --   --  252  HEPARINUNFRC  --   --  <0.10* 0.12* 0.29*  CREATININE 0.87  --   --   --   --   TROPONINI  --  <0.03  --   --   --     Estimated Creatinine Clearance: 117.8 mL/min (by C-G formula based on SCr of 0.87 mg/dL).   Medical History: Past Medical History:  Diagnosis Date  . Known health problems: none     Medications:  See medication history  Assessment: 42 yo lady to start heparin for CP.  She was not on anticoagulation PTA.  Hg 9.5, PTLC 252 Heparin level 0.29 units/ml  Goal of Therapy:  Heparin level 0.3-0.7 units/ml Monitor platelets by anticoagulation protocol: Yes   Plan:  Increase heparin gtt to 1900 units/hr Check heparin level 6 hours  Daily HL and CBC while on heparin  Thanks for allowing pharmacy to be a part of this patient's care.  Excell Seltzer, PharmD Clinical Pharmacist

## 2018-01-12 NOTE — Discharge Summary (Signed)
Physician Discharge Summary  Patient ID: Marissa Flores MRN: [id number] DOB/AGE: 06/29/1975 42 y.o.  Admit date: 01/11/2018 Discharge date: 01/12/2018  Admission Diagnoses: Chest pain  Discharge Diagnoses:  Chest pain  Discharged Condition: Stable  Hospital Course:  Angiographically normal coronary arteries with no significant coronary artery disease.  Troponin assay drawn in the ED was positive at 0.13 ng per mL.  However, further troponin assay were negative. Positive troponin may have been an outlier.  Given patient's chest pain without significant ischemic changes, this could have been an event of coronary vasospasm lead to vasovagal syncope.  Given patient's significant menorrhagia, do not recommend dual antiplatelet therapy.  Recommend statin and amlodipine as vasodilator therapy.  Consults: None  Significant Diagnostic Studies:  Normal cath  Echocardiogram 01/12/2018: Study Conclusions  - Left ventricle: The cavity size was normal. Wall thickness was   normal. Systolic function was normal. The estimated ejection   fraction was in the range of 55% to 60%. Wall motion was normal;   there were no regional wall motion abnormalities. Left   ventricular diastolic function parameters were normal. - Tricuspid valve: There was mild regurgitation. - Pulmonary arteries: PA peak pressure: 28 mm Hg (S).   Discharge Exam: Blood pressure 115/67, pulse 75, temperature 97.9 F (36.6 C), temperature source Oral, resp. rate 14, height 5\' 5"  (1.651 m), weight 136 kg, last menstrual period 01/04/2018, SpO2 99 %.  Nursing note and vitals reviewed. Constitutional: She is oriented to person, place, and time. She appears well-developed and well-nourished. No distress.  Morbid obesity  HENT:  Head: Normocephalic and atraumatic.  Eyes: Pupils are equal, round, and reactive to light. Conjunctivae are normal.  Neck: Normal range of motion. Neck supple. No JVD present.  Cardiovascular: Normal  rate, regular rhythm and normal heart sounds.  No murmur heard. Respiratory: Effort normal and breath sounds normal. She has no wheezes. She has no rales.  GI: Soft. Bowel sounds are normal. There is no tenderness. There is no rebound.  Musculoskeletal: She exhibits no edema.  Lymphadenopathy:    She has no cervical adenopathy.  Neurological: She is alert and oriented to person, place, and time. No cranial nerve deficit.  Skin: Skin is warm and dry.  Psychiatric: She has a normal mood and affect.      Disposition:   Discharge Instructions    Diet - low sodium heart healthy   Complete by:  As directed    Increase activity slowly   Complete by:  As directed      Allergies as of 01/12/2018   No Known Allergies     Medication List    STOP taking these medications   ibuprofen 200 MG tablet Commonly known as:  ADVIL,MOTRIN     TAKE these medications   amLODipine 5 MG tablet Commonly known as:  NORVASC Take 1 tablet (5 mg total) by mouth daily. Start taking on:  01/13/2018   rosuvastatin 10 MG tablet Commonly known as:  CRESTOR Take 1 tablet (10 mg total) by mouth daily at 6 PM.      Follow-up Information    Cherilyn Sautter, Reynold Bowen, MD Follow up on 01/26/2018.   Specialty:  Cardiology Why:  11:00 AM Contact information: [place] [address] [phone]           Signed: Nigel Mormon 01/12/2018, 9:20 PM  Nigel Mormon, MD [facility] Pager: [phone] Office: [phone] If no answer Cell [phone]

## 2018-01-12 NOTE — Progress Notes (Signed)
Discharge instruction was given to pt and family.  Mireyah Chervenak, RN 

## 2018-01-12 NOTE — Progress Notes (Signed)
  Echocardiogram 2D Echocardiogram has been performed.  Jennette Dubin 01/12/2018, 3:56 PM

## 2018-01-26 DIAGNOSIS — Z8759 Personal history of other complications of pregnancy, childbirth and the puerperium: Secondary | ICD-10-CM | POA: Diagnosis not present

## 2018-01-26 DIAGNOSIS — I10 Essential (primary) hypertension: Secondary | ICD-10-CM | POA: Diagnosis not present

## 2018-01-26 DIAGNOSIS — D509 Iron deficiency anemia, unspecified: Secondary | ICD-10-CM | POA: Diagnosis not present

## 2018-01-26 DIAGNOSIS — N920 Excessive and frequent menstruation with regular cycle: Secondary | ICD-10-CM | POA: Diagnosis not present

## 2018-03-05 DIAGNOSIS — K219 Gastro-esophageal reflux disease without esophagitis: Secondary | ICD-10-CM | POA: Diagnosis not present

## 2018-03-05 DIAGNOSIS — Z87898 Personal history of other specified conditions: Secondary | ICD-10-CM | POA: Diagnosis not present

## 2018-04-08 DIAGNOSIS — N920 Excessive and frequent menstruation with regular cycle: Secondary | ICD-10-CM | POA: Diagnosis not present

## 2018-04-08 DIAGNOSIS — N946 Dysmenorrhea, unspecified: Secondary | ICD-10-CM | POA: Diagnosis not present

## 2018-04-09 ENCOUNTER — Other Ambulatory Visit: Payer: Self-pay | Admitting: Obstetrics and Gynecology

## 2018-04-09 DIAGNOSIS — Z1231 Encounter for screening mammogram for malignant neoplasm of breast: Secondary | ICD-10-CM

## 2018-05-25 ENCOUNTER — Ambulatory Visit
Admission: RE | Admit: 2018-05-25 | Discharge: 2018-05-25 | Disposition: A | Discharge status: Home / Self Care | Payer: Commercial Managed Care - PPO | Source: Ambulatory Visit | Attending: Obstetrics and Gynecology | Admitting: Obstetrics and Gynecology

## 2018-05-25 DIAGNOSIS — Z1231 Encounter for screening mammogram for malignant neoplasm of breast: Secondary | ICD-10-CM

## 2018-06-19 ENCOUNTER — Other Ambulatory Visit: Payer: Self-pay | Admitting: Cardiology

## 2018-07-03 ENCOUNTER — Other Ambulatory Visit (HOSPITAL_COMMUNITY)
Admission: RE | Admit: 2018-07-03 | Discharge: 2018-07-03 | Disposition: A | Discharge status: Home / Self Care | Payer: Commercial Managed Care - PPO | Source: Ambulatory Visit | Attending: Obstetrics and Gynecology | Admitting: Obstetrics and Gynecology

## 2018-07-03 ENCOUNTER — Other Ambulatory Visit: Payer: Self-pay | Admitting: Obstetrics and Gynecology

## 2018-07-03 DIAGNOSIS — Z01419 Encounter for gynecological examination (general) (routine) without abnormal findings: Secondary | ICD-10-CM | POA: Insufficient documentation

## 2018-07-07 LAB — CYTOLOGY - PAP
Diagnosis: NEGATIVE
HPV (WINDOPATH): NOT DETECTED

## 2018-07-28 ENCOUNTER — Other Ambulatory Visit: Payer: Self-pay | Admitting: Obstetrics and Gynecology

## 2018-08-10 ENCOUNTER — Ambulatory Visit: Payer: Self-pay | Admitting: Cardiology

## 2018-09-27 ENCOUNTER — Other Ambulatory Visit: Payer: Self-pay | Admitting: Cardiology

## 2019-08-29 IMAGING — DX DG CHEST 2V
2 series · 2 of 2 positions shown · non-contrast
Comparison: None.

CLINICAL DATA: Chest pain

EXAM:
CHEST - 2 VIEW

[chest pa]
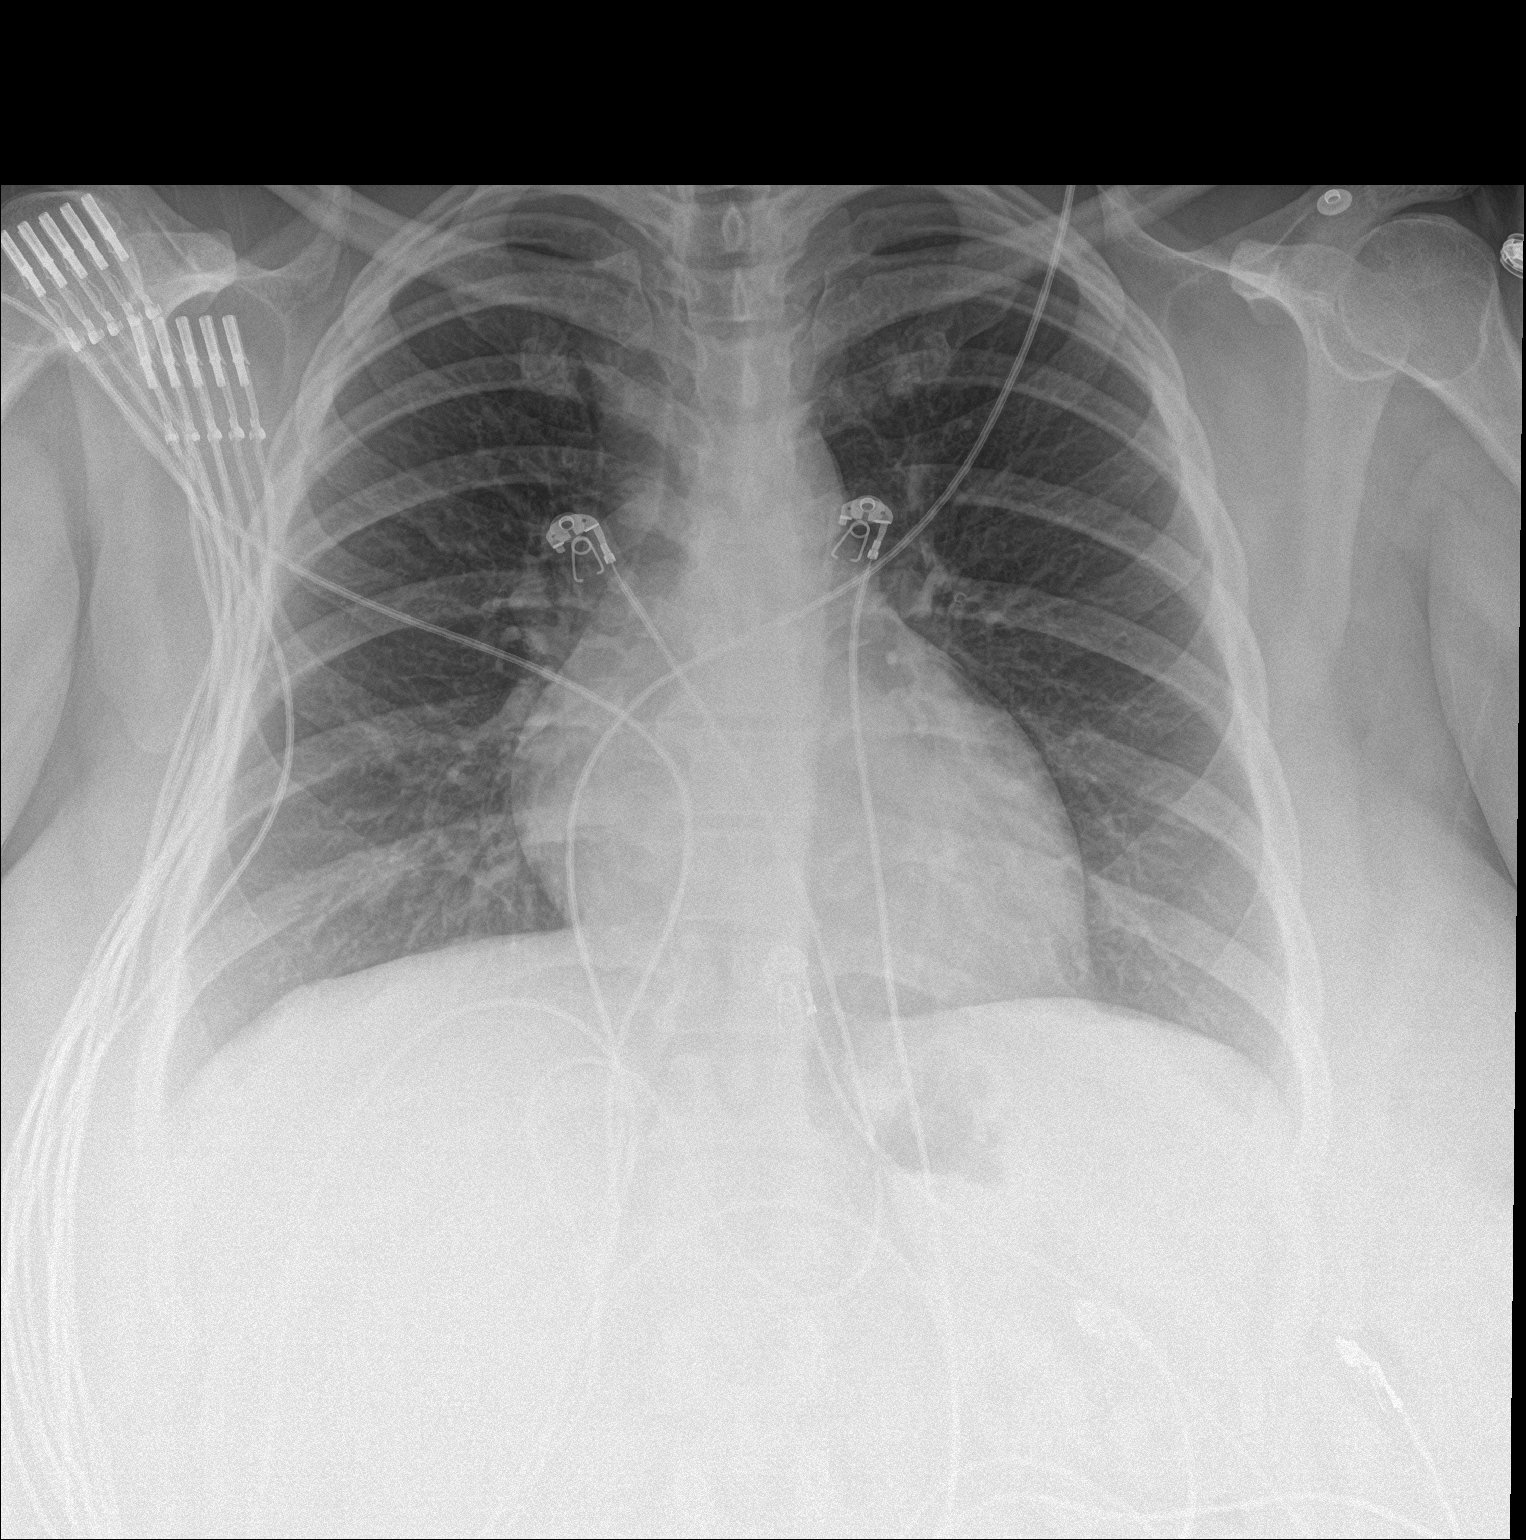

[chest lat]
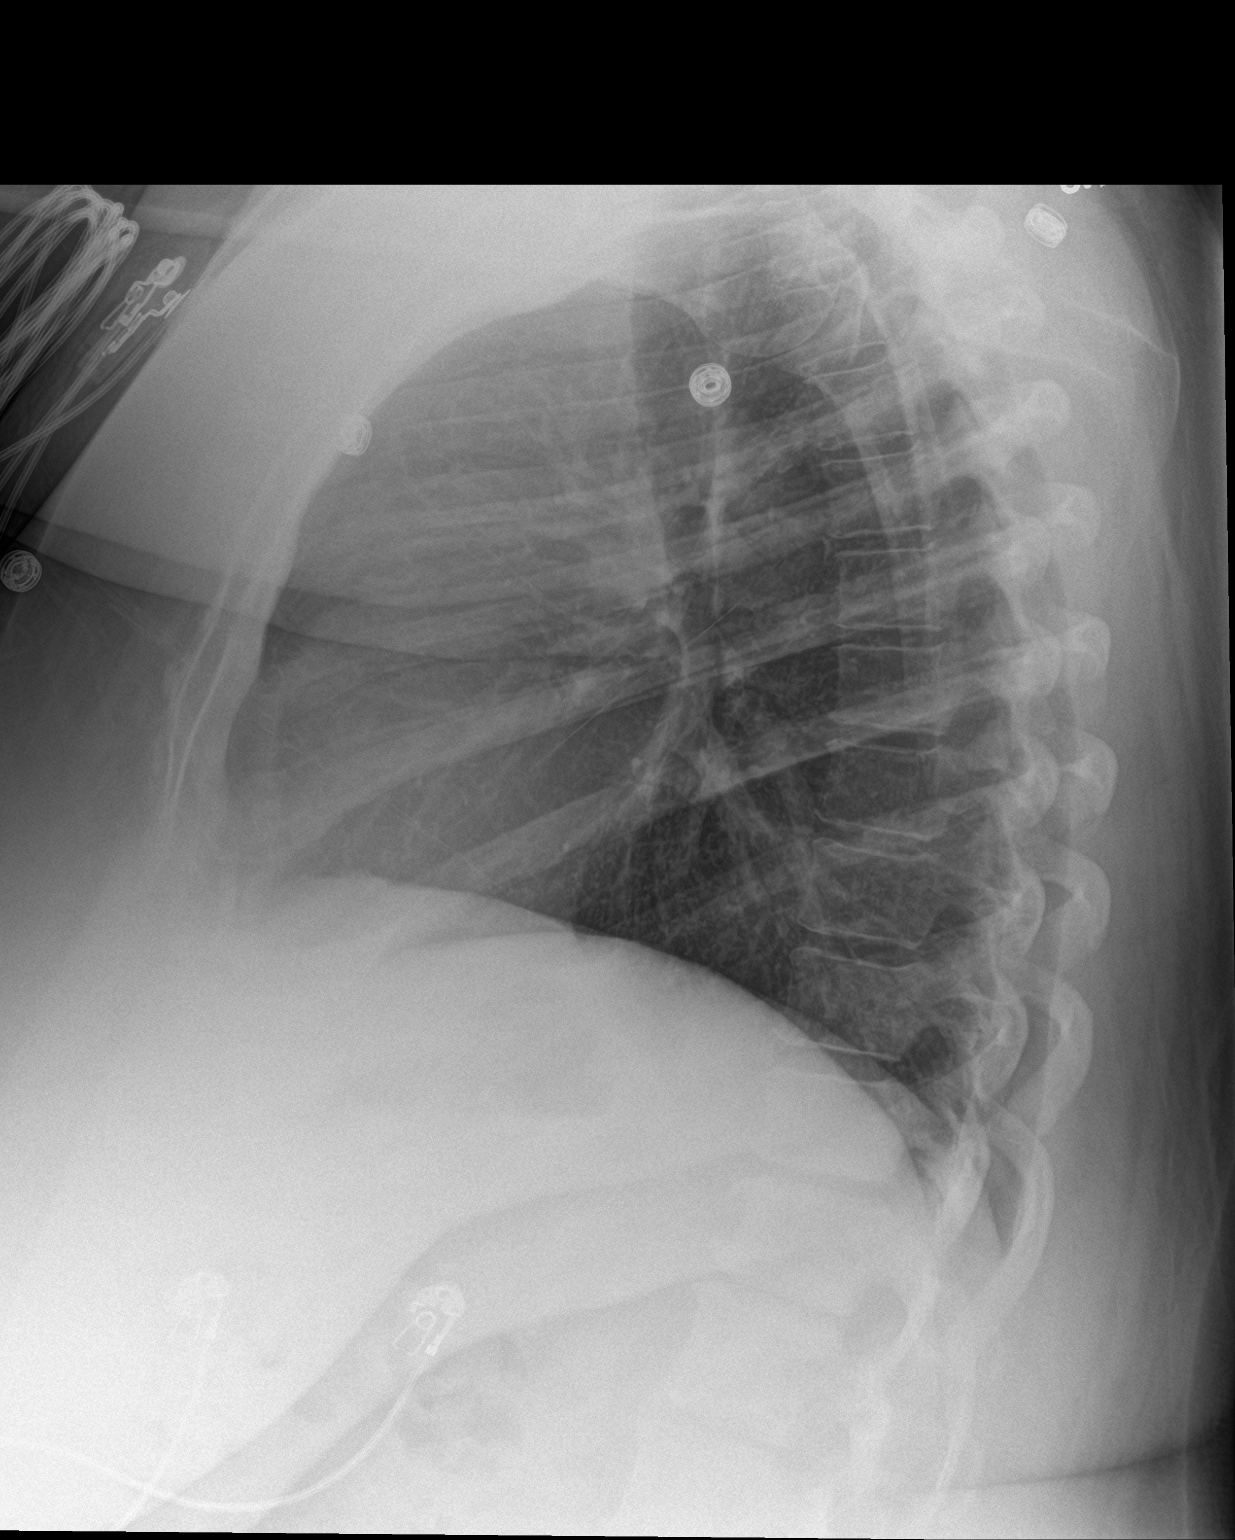

[2 of 2 positions shown; findings below may reference images not displayed]

FINDINGS: The heart size and mediastinal contours are within normal limits.
Both lungs are clear. The visualized skeletal structures are
unremarkable.
IMPRESSION: No active cardiopulmonary disease.

## 2019-08-29 IMAGING — CT CT HEAD W/O CM
4 series · 16 of 47 positions shown, 18 images · non-contrast
Comparison: None.

CLINICAL DATA: Patient woke at 2362 hours with severe chest pain
and then passed out.

EXAM:
CT HEAD WITHOUT CONTRAST
TECHNIQUE: Contiguous axial images were obtained from the base of the skull
through the vertex without intravenous contrast.

[Series 3: head wo · axial · 0.45mm/px · z∈[-158,-28]mm · 7 of 36 slices shown, 9 images]
[im 5/36  brain]
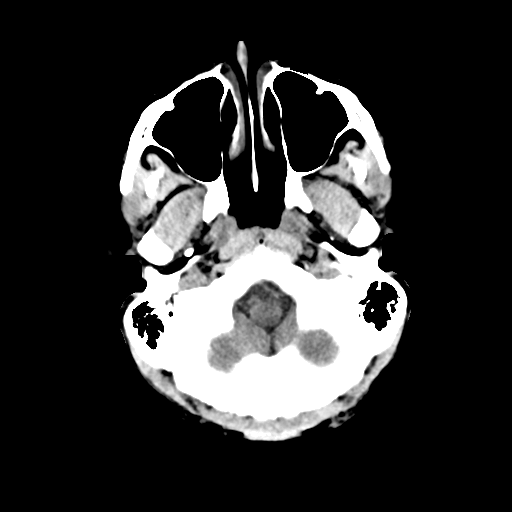
[im 5/36  bone]
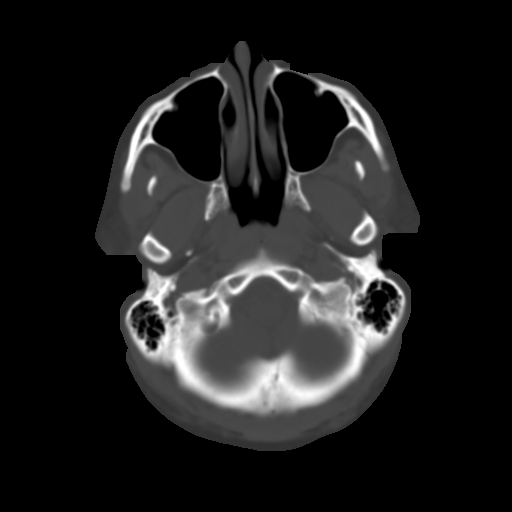
[im 9/36  brain]
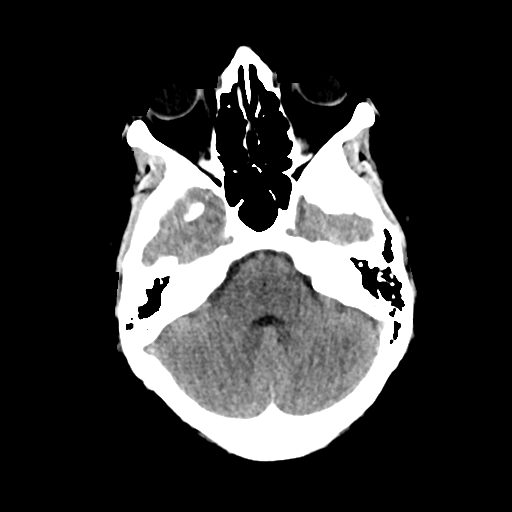
[im 14/36  brain]
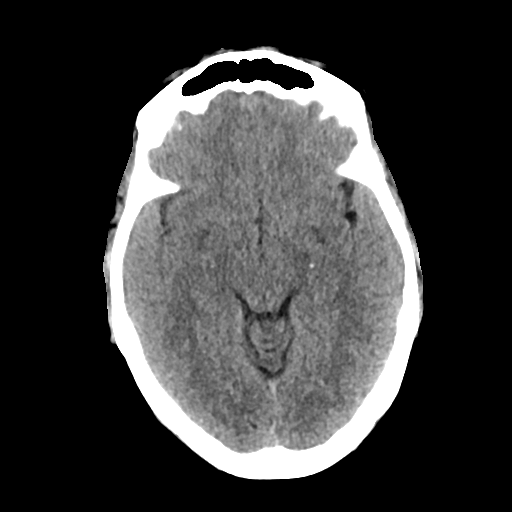
[im 18/36  brain]
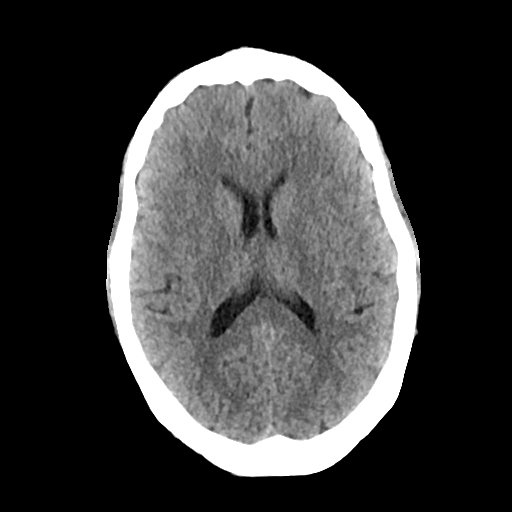
[im 22/36  brain]
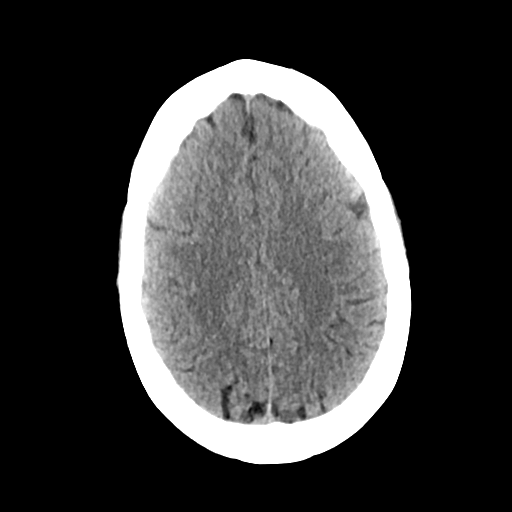
[im 22/36  bone]
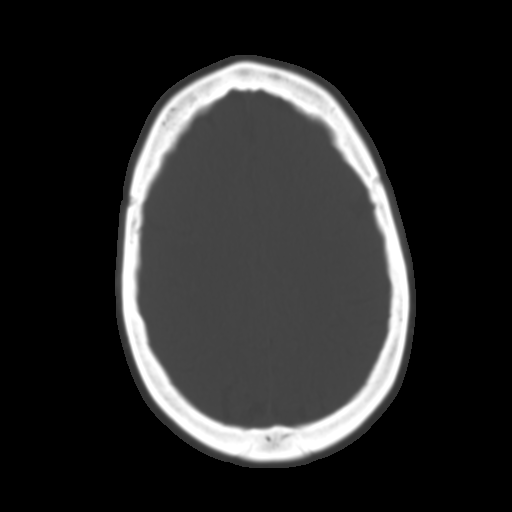
[im 27/36  brain]
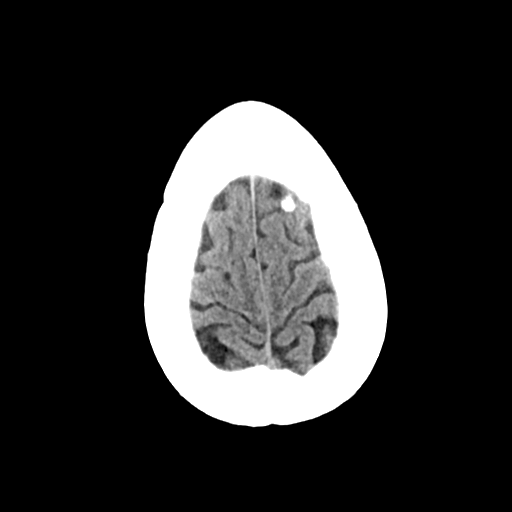
[im 31/36  brain]
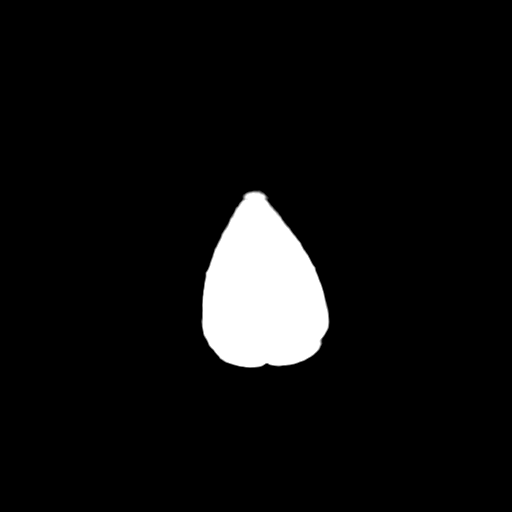

[Series 4: head bone · axial · 0.45mm/px · z∈[-162,-126]mm · 3 of 90 slices shown]
[im 9/90  bone]
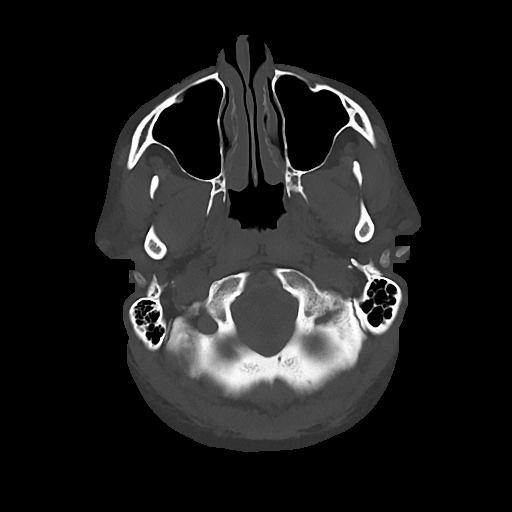
[im 18/90  bone]
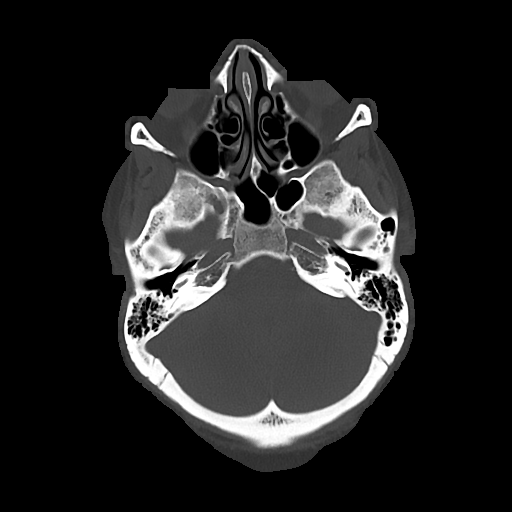
[im 27/90  bone]
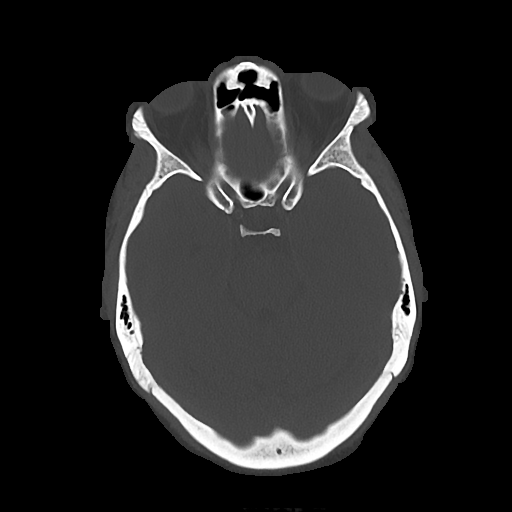

[Series 5: cor soft · coronal · 0.35mm/px · 3 of 74 slices shown]
[im 25/74  brain]
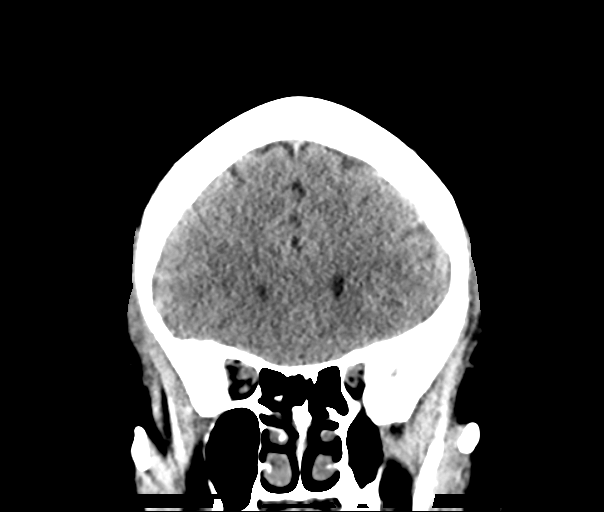
[im 33/74  brain]
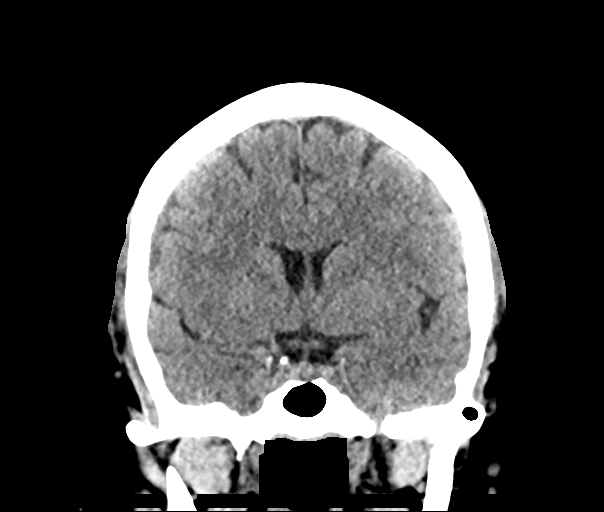
[im 41/74  brain]
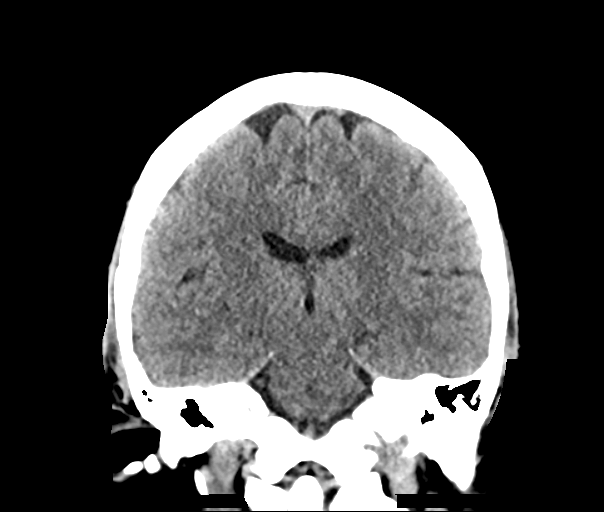

[Series 6: sag soft · sagittal · 0.35mm/px · 3 of 67 slices shown]
[im 23/67  brain]
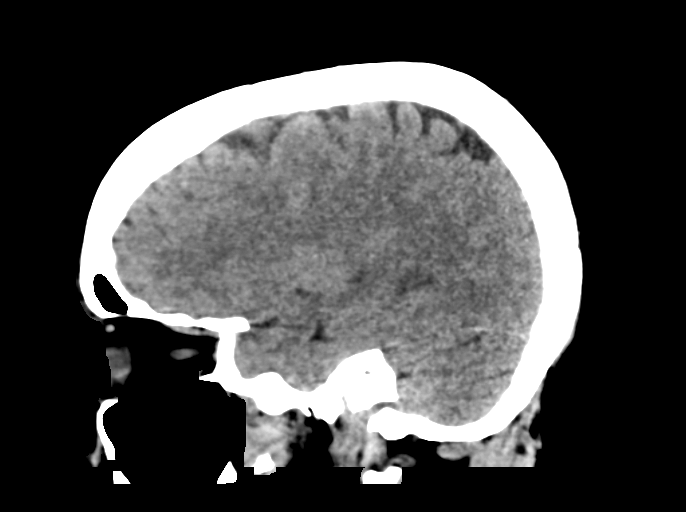
[im 34/67  brain]
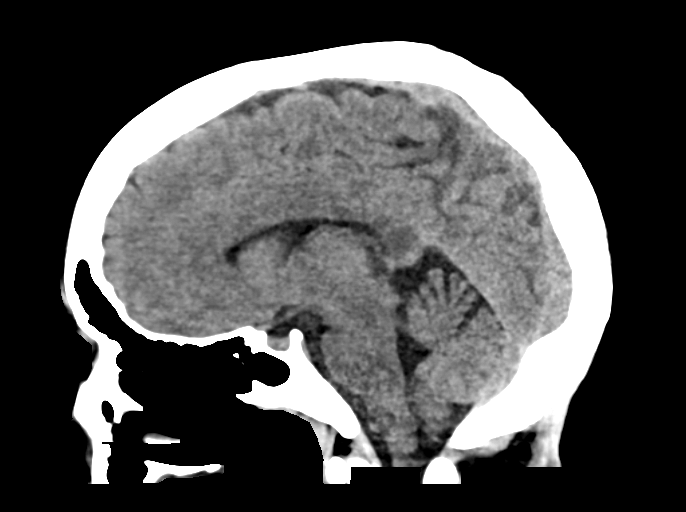
[im 45/67  brain]
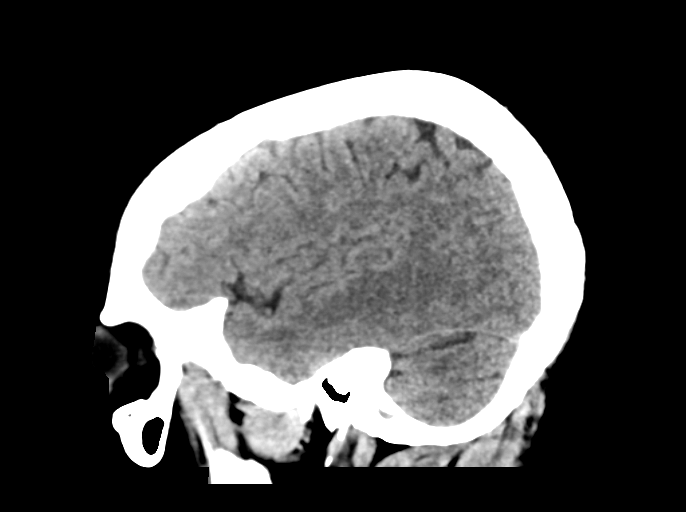

[16 of 47 positions shown; findings below may reference images not displayed]

FINDINGS: Brain: No evidence of acute infarction, hemorrhage, hydrocephalus,
extra-axial collection or mass lesion/mass effect. Focal basal
ganglia calcification on the left.

Vascular: No hyperdense vessel or unexpected calcification.

Skull: Normal. Negative for fracture or focal lesion.

Sinuses/Orbits: No acute finding.

Other: None.
IMPRESSION: No acute intracranial abnormality.

## 2019-09-07 NOTE — Patient Instructions (Signed)
DUE TO COVID-19 ONLY ONE VISITOR IS ALLOWED TO COME WITH YOU AND STAY IN THE WAITING ROOM ONLY DURING PRE OP AND PROCEDURE DAY OF SURGERY. THE 2 VISITORS MAY VISIT WITH YOU AFTER SURGERY IN YOUR PRIVATE ROOM DURING VISITING HOURS ONLY!  YOU NEED TO HAVE A COVID 19 TEST ON___5/8____ @__10 :55_____, THIS TEST MUST BE DONE BEFORE SURGERY, COME  [address], [place] , [address].  ([place]) ONCE YOUR COVID TEST IS COMPLETED, PLEASE BEGIN THE QUARANTINE INSTRUCTIONS AS OUTLINED IN YOUR HANDOUT.                Marissa Flores    Your procedure is scheduled on: 09/15/19   Report to [facility] Main  Entrance   Report to admitting at   10:15 AM     Call this number if you have problems the morning of surgery [phone]    Remember: Do not eat food or drink liquids :After Midnight.   BRUSH YOUR TEETH MORNING OF SURGERY AND RINSE YOUR MOUTH OUT, NO CHEWING GUM CANDY OR MINTS.     Take these medicines the morning of surgery with A SIP OF WATER: Amlodipine, Omeprizole                                 You may not have any metal on your body including hair pins and              piercings  Do not wear jewelry, make-up, lotions, powders or perfumes, deodorant             Do not wear nail polish on your fingernails.  Do not shave  48 hours prior to surgery.             Do not bring valuables to the hospital. [place].  Contacts, dentures or bridgework may not be worn into surgery.      Patients discharged the day of surgery will not be allowed to drive home . IF YOU ARE HAVING SURGERY AND GOING HOME THE SAME DAY, YOU MUST HAVE AN ADULT TO DRIVE YOU HOME AND BE WITH YOU FOR 24 HOURS.  YOU MAY GO HOME BY TAXI OR UBER OR ORTHERWISE, BUT AN ADULT MUST ACCOMPANY YOU HOME AND STAY WITH YOU FOR 24 HOURS.  Name and phone number of your driver:  Special Instructions: N/A              Please read over the following  fact sheets you were given: _____________________________________________________________________             [facility] - Preparing for Surgery Before surgery, you can play an important role.   Because skin is not sterile, your skin needs to be as free of germs as possible .  You can reduce the number of germs on your skin by washing with CHG (chlorahexidine gluconate) soap before surgery.   CHG is an antiseptic cleaner which kills germs and bonds with the skin to continue killing germs even after washing. Please DO NOT use if you have an allergy to CHG or antibacterial soaps.   If your skin becomes reddened/irritated stop using the CHG and inform your nurse when you arrive at Short Stay. Do not shave (including legs and underarms) for at least 48 hours prior to  the first CHG shower.    Please follow these instructions carefully:  1.  Shower with CHG Soap the night before surgery and the  morning of Surgery.  2.  If you choose to wash your hair, wash your hair first as usual with your  normal  shampoo.  3.  After you shampoo, rinse your hair and body thoroughly to remove the  shampoo.                                        4.  Use CHG as you would any other liquid soap.  You can apply chg directly  to the skin and wash                       Gently with a scrungie or clean washcloth.  5.  Apply the CHG Soap to your body ONLY FROM THE NECK DOWN.   Do not use on face/ open                           Wound or open sores. Avoid contact with eyes, ears mouth and genitals (private parts).                       Wash face,  Genitals (private parts) with your normal soap.             6.  Wash thoroughly, paying special attention to the area where your surgery  will be performed.  7.  Thoroughly rinse your body with warm water from the neck down.  8.  DO NOT shower/wash with your normal soap after using and rinsing off  the CHG Soap.             9.  Pat yourself dry with a clean towel.             10.  Wear clean pajamas.            11.  Place clean sheets on your bed the night of your first shower and do not  sleep with pets. Day of Surgery : Do not apply any lotions/deodorants the morning of surgery.  Please wear clean clothes to the hospital/surgery center.  FAILURE TO FOLLOW THESE INSTRUCTIONS MAY RESULT IN THE CANCELLATION OF YOUR SURGERY PATIENT SIGNATURE_________________________________  NURSE SIGNATURE__________________________________  ________________________________________________________________________

## 2019-09-08 ENCOUNTER — Encounter (HOSPITAL_COMMUNITY)
Admission: RE | Admit: 2019-09-08 | Discharge: 2019-09-08 | Disposition: A | Discharge status: Home / Self Care | Payer: Commercial Managed Care - PPO | Source: Ambulatory Visit | Attending: Obstetrics and Gynecology | Admitting: Obstetrics and Gynecology

## 2019-09-08 ENCOUNTER — Other Ambulatory Visit: Payer: Self-pay | Admitting: Obstetrics and Gynecology

## 2019-09-08 ENCOUNTER — Other Ambulatory Visit: Payer: Self-pay

## 2019-09-08 ENCOUNTER — Encounter (HOSPITAL_COMMUNITY): Payer: Self-pay

## 2019-09-08 DIAGNOSIS — Z01818 Encounter for other preprocedural examination: Secondary | ICD-10-CM | POA: Insufficient documentation

## 2019-09-08 DIAGNOSIS — I10 Essential (primary) hypertension: Secondary | ICD-10-CM | POA: Diagnosis not present

## 2019-09-08 HISTORY — DX: Anemia, unspecified: D64.9

## 2019-09-08 HISTORY — DX: Gastro-esophageal reflux disease without esophagitis: K21.9

## 2019-09-08 NOTE — Progress Notes (Signed)
PCP - Dr. Carney Living Cardiologist - Dr. Flora Lipps  Chest x-ray - 2019 EKG - 09/09/19 Stress Test -no ECHO - 01/12/18 Cardiac Cath - 01/12/18  Sleep Study - no CPAP -   Fasting Blood Sugar - NA Checks Blood Sugar _____ times a day  Blood Thinner Instructions:no Aspirin Instructions: Last Dose:  Anesthesia review:   Patient denies shortness of breath, fever, cough and chest pain at PAT appointment yes  Patient verbalized understanding of instructions that were given to them at the PAT appointment. Patient was also instructed that they will need to review over the PAT instructions again at home before surgery. Yes  Pt reports that she is taking amlodipine to increase 02 to the heart and prevent spasms.

## 2019-09-09 ENCOUNTER — Encounter (HOSPITAL_COMMUNITY)
Admission: RE | Admit: 2019-09-09 | Discharge: 2019-09-09 | Disposition: A | Discharge status: Home / Self Care | Payer: Commercial Managed Care - PPO | Source: Ambulatory Visit | Attending: Obstetrics and Gynecology | Admitting: Obstetrics and Gynecology

## 2019-09-09 DIAGNOSIS — Z01818 Encounter for other preprocedural examination: Secondary | ICD-10-CM | POA: Diagnosis not present

## 2019-09-09 DIAGNOSIS — I10 Essential (primary) hypertension: Secondary | ICD-10-CM | POA: Diagnosis not present

## 2019-09-09 LAB — BASIC METABOLIC PANEL
Anion gap: 8 (ref 5–15)
BUN: 13 mg/dL (ref 6–20)
CO2: 23 mmol/L (ref 22–32)
Calcium: 8.5 mg/dL — ABNORMAL LOW (ref 8.9–10.3)
Chloride: 106 mmol/L (ref 98–111)
Creatinine, Ser: 0.72 mg/dL (ref 0.44–1.00)
GFR calc Af Amer: >60 mL/min (ref >60)
GFR calc non Af Amer: >60 mL/min (ref >60)
Glucose, Bld: 112 mg/dL — ABNORMAL HIGH (ref 70–99)
Potassium: 4 mmol/L (ref 3.5–5.1)
Sodium: 137 mmol/L (ref 135–145)

## 2019-09-09 LAB — CBC
HCT: 27.3 % — ABNORMAL LOW (ref 36.0–46.0)
Hemoglobin: 7.3 g/dL — ABNORMAL LOW (ref 12.0–15.0)
MCH: 18 pg — ABNORMAL LOW (ref 26.0–34.0)
MCHC: 26.7 g/dL — ABNORMAL LOW (ref 30.0–36.0)
MCV: 67.2 fL — ABNORMAL LOW (ref 80.0–100.0)
Platelets: 243 10*3/uL (ref 150–400)
RBC: 4.06 MIL/uL (ref 3.87–5.11)
RDW: 18 % — ABNORMAL HIGH (ref 11.5–15.5)
WBC: 8 10*3/uL (ref 4.0–10.5)
nRBC: 0 % (ref 0.0–0.2)

## 2019-09-09 LAB — ABO/RH: ABO/RH(D): B POS

## 2019-09-11 ENCOUNTER — Ambulatory Visit (HOSPITAL_COMMUNITY)
Admission: RE | Admit: 2019-09-11 | Discharge: 2019-09-11 | Disposition: A | Discharge status: Home / Self Care | Payer: Commercial Managed Care - PPO | Source: Ambulatory Visit | Attending: Obstetrics and Gynecology | Admitting: Obstetrics and Gynecology

## 2019-09-11 DIAGNOSIS — Z01812 Encounter for preprocedural laboratory examination: Secondary | ICD-10-CM | POA: Diagnosis present

## 2019-09-11 DIAGNOSIS — Z20822 Contact with and (suspected) exposure to covid-19: Secondary | ICD-10-CM | POA: Diagnosis not present

## 2019-09-11 LAB — SARS CORONAVIRUS 2 (TAT 6-24 HRS): SARS Coronavirus 2: NEGATIVE

## 2019-09-14 ENCOUNTER — Other Ambulatory Visit: Payer: Self-pay | Admitting: Obstetrics and Gynecology

## 2019-09-14 LAB — PREPARE RBC (CROSSMATCH)

## 2019-09-14 MED ORDER — SODIUM CHLORIDE 0.9% IV SOLUTION: Freq: Once | INTRAVENOUS | Status: AC

## 2019-09-14 NOTE — H&P (Signed)
Subjective:    Chief Complaint(s):      Fibroids and menorrhagia       HPI:          Isolation Precautions          Respiratory Illness Screening  1. Is fever present / reported?  No,  2. Are respiratory illness symptom(s) present / reported?  No,  3. Are other symptom(s) present / reported?  No,  5. Has there been reported travel to a High Risk respiratory illness region?  Unknown,  6. Has close* contact with person(s) known to have communicable illness been reported?  No. Has patient been tested for COVID-19?  08/30/2019-negative.  Has patient received COVID-19 vaccination?  Yes- Moderna.         General          44 yo presents for pre-op visit.            Pt is scheduled for robotic assisted laparoscopic hysterectomy w/ bilateral salpingectomy on Sep 15, 2019 for management of menorrhagia.            H/o BTL.            Pt seen on Jun 10, 2018 c/o menorrhagia. U/S on Jun 10, 2018 revealed uterus measuring 14.5 x 11.1 x 11.4 cm. Endometrium was thickened at 1.42 cm. Multiple fibroids visualized and 8 measured with the largest submucosal and measuring 7.2 cm. RT OV WNL. LT OV could not be visualized. Pt expressed interest in hysterectomy. She was prescribed Lysteda in the interim.            EMB performed Jul 28, 2018 revealed benign endometrial type polyp.            Today, pt reports BTL was with her C-section with her last child.             She has had both [place] vaccinations.            Pelvic exam revealed 16 week size uterus.     Current Medication:       Taking         Norvasc(amLODIPine Besylate) 5 MG Tablet 1 tablet Orally Once a day.         Omeprazole 20 MG Capsule Delayed Release 1 capsule 30 minutes before morning meal Orally Once a day.         Ibuprofen 600 MG Tablet 1 tablet by mouth every 6 hrs prn x 1 week per month, Notes: during cycle.       Not-Taking         Diflucan(Fluconazole) 150 MG Tablet 1 tablet Orally qd.         Diflucan(Fluconazole) 150 MG Tablet 1  tablet Orally 3 days .. take 1 tablet then repeat in 3 days.         Lysteda(Tranexamic Acid) 650 MG Tablet 2 tablets Orally Three times a day.         Medication List reviewed and reconciled with the patient.     Medical History:   syncope eval 2019, neg cardiac pulm eval, no arrhymia, ???Coronary vasospasm      morbid obesity      acid reflux       Allergies/Intolerance:      N.K.D.A.    Gyn History:   Sexual activity currently sexually active.   Periods : every month.   LMP 07/20/2019.   Birth control BTL.   Last pap smear date 07/03/2018 - negative .  Last mammogram date 05/25/2018-normal .   Abnormal pap smear assessed with colposcopy.   Denies STD.        OB History:   Number of pregnancies  4.   miscarriages  1.   Pregnancy # 1  boy, live birth, C-section delivery.   Pregnancy # 2  live birth, boy, vaginal delivery.   Pregnancy # 3  miscarriage.   Pregnancy # 4:  live birth, girl, C-section.         Surgical History:   C section x 2      tubal ligation at time of 2nd cesarean section      pilonidal cyst      Cerclage placement 2008        Hospitalization:   syncope      child birth       Family History:   Father: deceased, aneurysm, diagnosed with CVA    Mother: alive    Brother 78: alive    Sister 1: alive, borderline for DM    1 brother(s) , 1 sister(s) . 2 son(s) , 1 daughter(s) .          denies any GYN family cancer hx.     Social History:       General         Tobacco use cigarettes:  Never smoked, Tobacco history last updated  09/02/2019.           Alcohol: yes, occasionally.           no Recreational drug use.           Marital Status: married, Husbands name is Hydrologist.           Children: 2, Boys, 1, girls.           OCCUPATION: employed, [employer]:       CONSTITUTIONAL         Chills  No.  Fatigue  No.  Fever  No.  Night sweats  No.  Recent travel outside [place]  No.  Sweats  No.  Weight change  No.          OPHTHALMOLOGY         Blurring of vision  no.  Change in vision  no.  Double vision  no.         ENT         Dizziness  no.  Nose bleeds  no.  Sore throat  no.  Teeth pain  no.         ALLERGY         Hives  no.         CARDIOLOGY         Chest pain  no.  High blood pressure  no.  Irregular heart beat  no.  Leg edema  no.  Palpitations  no.         RESPIRATORY         Shortness of breath  no.  Cough  no.  Wheezing  no.         UROLOGY         Pain with urination  no.  Urinary urgency  no.  Urinary frequency  no.  Urinary incontinence  no.  Difficulty urinating  No.  Blood in urine  No.         GASTROENTEROLOGY         Abdominal pain  no.  Appetite change  no.  Bloating/belching  no.  Blood in stool or on toilet paper  no.  Change in bowel movements  no.  Constipation  no.  Diarrhea  no.  Difficulty swallowing  no.  Nausea  no.         FEMALE REPRODUCTIVE         Vulvar pain  no.  Vulvar rash  no.  Abnormal vaginal bleeding  no.  Breast pain  no.  Nipple discharge  no.  Pain with intercourse  no.  Pelvic pain  no.  Unusual vaginal discharge  no.  Vaginal itching  no.         MUSCULOSKELETAL         Muscle aches  no.         NEUROLOGY         Headache  no.  Tingling/numbness  no.  Weakness  no.         PSYCHOLOGY         Depression  no.  Anxiety  no.  Nervousness  no.  Sleep disturbances  no.  Suicidal ideation  no .         ENDOCRINOLOGY         Excessive thirst  no.  Excessive urination  no.  Hair loss  no.  Heat or cold intolerance  no.         HEMATOLOGY/LYMPH         Abnormal bleeding  no.  Easy bruising  no.  Swollen glands  no.         DERMATOLOGY         New/changing skin lesion  no.  Rash  no.  Sores  no.             Negative except as stated in HPI.   Objective:    Vitals:        Wt 318.0, Wt change 8.2 lb, Ht 64.5, BMI 53.74, Temp 98.2, BP sitting 130/80.     Past Results:    Examination:          General Examination         CONSTITUTIONAL: alert,  oriented, NAD .          SKIN:  moist, warm.          EYES:  Conjunctiva clear.          LUNGS: clear to auscultation bilaterally.          HEART:  regular rate and rhythm.          ABDOMEN: soft, non-tender/non-distended, bowel sounds present .          FEMALE GENITOURINARY: normal external genitalia, labia - unremarkable, vagina - pink moist mucosa, no lesions or abnormal discharge, cervix - no discharge or lesions or CMT, adnexa - no masses or tenderness, uterus - nontender, 16 wk size uterus.          EXTREMITIES:  no edema present.          PSYCH:  affect normal, good eye contact.      Physical Examination:       Chaperone present          Chaperone present Brothers,Sadie 09/02/2019 08:47:16 AM > , for pelvic exam.            Pt aware of scribe services today.    Assessment:     Assessment:    Menorrhagia with regular cycle - N92.0 (Primary)      Fibroids - D25.9  Iron deficiency anemia due to chronic blood loss - D50.0        Plan:    Treatment:      Menorrhagia with regular cycle          Notes: Pt is scheduled for robotic assisted laparoscopic hysterectomy w/ bilateral salpingectomy on Sep 15, 2019 for management of menorrhagia and fibroids. Denies FHx ovarian cancer. Discussed removal of ovaries prior to 50 may lead to premature menopause. Pt would like to keep her ovaries at this time. Given pt's weight and BMI of 53, discussed that surgery may have to be converted to Open abdominal hysterectomy due to possible difficulty ventilating i the trendelenburg position . Marland Kitchen Pt will need clearance from PCP given BMI is 53. If not, pt will stay 1 night in the hospital. Discussed that in order to be discharged from hospital, she will need to be able to walk around, urinate, tolerate food, and take pain medication by mouth. Discussed w/ pt risks and benefits of hysterectomy including but not limited to infection/bleeding, conversion to larger incision, potential for development  of scar tissue, damage to bowel, bladder, ureters and surrounding organs, with the need for further surgery. Discussed risk of blood transfusion and risk of HIV or hep B&C (1 out of 2 million and 1 out of 200,000, respectively) with blood transfusion. Pt is aware of risks and desires blood transfusion if needed. Advised pt to avoid driving for 1 week following procedure. Also advised pt avoid lifting weight greater than 10 lbs or intercourse for 6-8 weeks after procedure. Pt advised to avoid NSAIDs (Asprin, Aleve, Advil, Ibuprofen, Motrin) starting Sep 04, 2019 given risk of bleeding during surgery due to blood platelets being affected. She may take Tylenol for pain management. She is advised to avoid eating starting midnight prior to surgery. Follow up 2 wks after surgery for post-op evaluation.      Fibroids          Notes: Pt is scheduled for robotic assisted laparoscopic hysterectomy w/ bilateral salpingectomy possible total abdominal hysterectomy with bilateral salpingectomy on Sep 15, 2019 for management of menorrhagia and fibroids. Follow up for 2 wk post-op visit.      Iron deficiency anemia due to chronic blood loss          Notes: Call from preop on 09/13/2019 that hgb is 7.1.Marland KitchenMarland Kitchen Order for 2 units prbcs on call to or. Order placed with [place] in the blood bank at [facility].

## 2019-09-14 NOTE — H&P (Deleted)
  The note originally documented on this encounter has been moved the the encounter in which it belongs.  

## 2019-09-15 ENCOUNTER — Ambulatory Visit (HOSPITAL_COMMUNITY): Payer: Commercial Managed Care - PPO | Admitting: Physician Assistant

## 2019-09-15 ENCOUNTER — Ambulatory Visit (HOSPITAL_COMMUNITY): Payer: Commercial Managed Care - PPO | Admitting: Anesthesiology

## 2019-09-15 ENCOUNTER — Encounter (HOSPITAL_COMMUNITY): Payer: Self-pay | Admitting: Obstetrics and Gynecology

## 2019-09-15 ENCOUNTER — Other Ambulatory Visit: Payer: Self-pay

## 2019-09-15 ENCOUNTER — Observation Stay (HOSPITAL_COMMUNITY)
Admission: RE | Admit: 2019-09-15 | Discharge: 2019-09-16 | Disposition: A | Discharge status: Home / Self Care | Payer: Commercial Managed Care - PPO | Source: Ambulatory Visit | Attending: Obstetrics and Gynecology | Admitting: Obstetrics and Gynecology

## 2019-09-15 ENCOUNTER — Encounter (HOSPITAL_COMMUNITY)
Admission: RE | Disposition: A | Discharge status: Home / Self Care | Payer: Self-pay | Source: Ambulatory Visit | Attending: Obstetrics and Gynecology

## 2019-09-15 DIAGNOSIS — D500 Iron deficiency anemia secondary to blood loss (chronic): Secondary | ICD-10-CM | POA: Insufficient documentation

## 2019-09-15 DIAGNOSIS — D251 Intramural leiomyoma of uterus: Principal | ICD-10-CM | POA: Insufficient documentation

## 2019-09-15 DIAGNOSIS — K219 Gastro-esophageal reflux disease without esophagitis: Secondary | ICD-10-CM | POA: Insufficient documentation

## 2019-09-15 DIAGNOSIS — Z6841 Body Mass Index (BMI) 40.0 and over, adult: Secondary | ICD-10-CM | POA: Diagnosis not present

## 2019-09-15 DIAGNOSIS — N920 Excessive and frequent menstruation with regular cycle: Secondary | ICD-10-CM | POA: Insufficient documentation

## 2019-09-15 DIAGNOSIS — Z79899 Other long term (current) drug therapy: Secondary | ICD-10-CM | POA: Insufficient documentation

## 2019-09-15 DIAGNOSIS — Z90710 Acquired absence of both cervix and uterus: Secondary | ICD-10-CM | POA: Diagnosis present

## 2019-09-15 HISTORY — PX: ROBOTIC ASSISTED LAPAROSCOPIC HYSTERECTOMY AND SALPINGECTOMY: SHX6379

## 2019-09-15 LAB — PREGNANCY, URINE: Preg Test, Ur: NEGATIVE

## 2019-09-15 LAB — PREPARE RBC (CROSSMATCH)

## 2019-09-15 SURGERY — XI ROBOTIC ASSISTED LAPAROSCOPIC HYSTERECTOMY AND SALPINGECTOMY
Anesthesia: General | Laterality: Bilateral

## 2019-09-15 MED ORDER — SODIUM CHLORIDE 0.9 % IR SOLN
Status: DC | PRN
  Administered 2019-09-15: 1000 mL via INTRAVESICAL

## 2019-09-15 MED ORDER — LABETALOL HCL 5 MG/ML IV SOLN
INTRAVENOUS | Status: AC
  Filled 2019-09-15: qty 4

## 2019-09-15 MED ORDER — FENTANYL CITRATE (PF) 100 MCG/2ML IJ SOLN
25.0000 ug | INTRAMUSCULAR | Status: DC | PRN
  Administered 2019-09-15 (×3): 50 ug via INTRAVENOUS

## 2019-09-15 MED ORDER — LACTATED RINGERS IV SOLN: INTRAVENOUS | Status: DC

## 2019-09-15 MED ORDER — SIMETHICONE 80 MG PO CHEW: 80.0000 mg | CHEWABLE_TABLET | Freq: Four times a day (QID) | ORAL | Status: DC | PRN

## 2019-09-15 MED ORDER — FENTANYL CITRATE (PF) 100 MCG/2ML IJ SOLN
INTRAMUSCULAR | Status: AC
  Filled 2019-09-15: qty 2

## 2019-09-15 MED ORDER — AMLODIPINE BESYLATE 5 MG PO TABS
5.0000 mg | ORAL_TABLET | Freq: Every day | ORAL | Status: DC
  Administered 2019-09-16: 5 mg via ORAL
  Filled 2019-09-15: qty 1

## 2019-09-15 MED ORDER — ROPIVACAINE HCL 5 MG/ML IJ SOLN
INTRAMUSCULAR | Status: AC
  Filled 2019-09-15: qty 60

## 2019-09-15 MED ORDER — ONDANSETRON HCL 4 MG/2ML IJ SOLN: 4.0000 mg | Freq: Four times a day (QID) | INTRAMUSCULAR | Status: DC | PRN

## 2019-09-15 MED ORDER — HYDROMORPHONE HCL 1 MG/ML IJ SOLN: 0.2000 mg | INTRAMUSCULAR | Status: DC | PRN

## 2019-09-15 MED ORDER — GABAPENTIN 300 MG PO CAPS
300.0000 mg | ORAL_CAPSULE | ORAL | Status: AC
  Administered 2019-09-15: 11:00:00 300 mg via ORAL
  Filled 2019-09-15: qty 1

## 2019-09-15 MED ORDER — METHYLENE BLUE 0.5 % INJ SOLN
INTRAVENOUS | Status: DC | PRN
  Administered 2019-09-15: 10 mL

## 2019-09-15 MED ORDER — KETOROLAC TROMETHAMINE 30 MG/ML IJ SOLN
INTRAMUSCULAR | Status: AC
  Filled 2019-09-15: qty 1

## 2019-09-15 MED ORDER — MIDAZOLAM HCL 2 MG/2ML IJ SOLN
INTRAMUSCULAR | Status: AC
  Filled 2019-09-15: qty 2

## 2019-09-15 MED ORDER — SUGAMMADEX SODIUM 500 MG/5ML IV SOLN
INTRAVENOUS | Status: AC
  Filled 2019-09-15: qty 5

## 2019-09-15 MED ORDER — SODIUM CHLORIDE 0.9 % IV SOLN
INTRAVENOUS | Status: DC | PRN
  Administered 2019-09-15: 120 mL

## 2019-09-15 MED ORDER — HEMOSTATIC AGENTS (NO CHARGE) OPTIME
TOPICAL | Status: DC | PRN
  Administered 2019-09-15: 1 via TOPICAL

## 2019-09-15 MED ORDER — ACETAMINOPHEN 500 MG PO TABS
1000.0000 mg | ORAL_TABLET | ORAL | Status: AC
  Administered 2019-09-15: 1000 mg via ORAL
  Filled 2019-09-15: qty 2

## 2019-09-15 MED ORDER — ONDANSETRON HCL 4 MG/2ML IJ SOLN
INTRAMUSCULAR | Status: DC | PRN
  Administered 2019-09-15: 4 mg via INTRAVENOUS

## 2019-09-15 MED ORDER — ESMOLOL HCL 100 MG/10ML IV SOLN
INTRAVENOUS | Status: DC | PRN
Start: 2019-09-15 — End: 2019-09-15
  Administered 2019-09-15: 30 mg via INTRAVENOUS

## 2019-09-15 MED ORDER — SENNA 8.6 MG PO TABS
1.0000 | ORAL_TABLET | Freq: Two times a day (BID) | ORAL | Status: DC
  Administered 2019-09-15 – 2019-09-16 (×2): 8.6 mg via ORAL
  Filled 2019-09-15 (×2): qty 1

## 2019-09-15 MED ORDER — SODIUM CHLORIDE (PF) 0.9 % IJ SOLN
INTRAMUSCULAR | Status: AC
  Filled 2019-09-15: qty 50

## 2019-09-15 MED ORDER — LABETALOL HCL 5 MG/ML IV SOLN
INTRAVENOUS | Status: DC | PRN
  Administered 2019-09-15 (×2): 5 mg via INTRAVENOUS

## 2019-09-15 MED ORDER — PANTOPRAZOLE SODIUM 40 MG IV SOLR
40.0000 mg | Freq: Every day | INTRAVENOUS | Status: DC
  Administered 2019-09-15: 40 mg via INTRAVENOUS
  Filled 2019-09-15: qty 40

## 2019-09-15 MED ORDER — ESMOLOL HCL 100 MG/10ML IV SOLN
INTRAVENOUS | Status: AC
  Filled 2019-09-15: qty 10

## 2019-09-15 MED ORDER — ONDANSETRON HCL 4 MG PO TABS: 4.0000 mg | ORAL_TABLET | Freq: Four times a day (QID) | ORAL | Status: DC | PRN

## 2019-09-15 MED ORDER — ROCURONIUM BROMIDE 10 MG/ML (PF) SYRINGE
PREFILLED_SYRINGE | INTRAVENOUS | Status: DC | PRN
  Administered 2019-09-15: 10 mg via INTRAVENOUS
  Administered 2019-09-15: 100 mg via INTRAVENOUS
  Administered 2019-09-15: 20 mg via INTRAVENOUS

## 2019-09-15 MED ORDER — DEXAMETHASONE SODIUM PHOSPHATE 10 MG/ML IJ SOLN
INTRAMUSCULAR | Status: DC | PRN
  Administered 2019-09-15: 10 mg via INTRAVENOUS

## 2019-09-15 MED ORDER — LACTATED RINGERS IR SOLN
Status: DC | PRN
  Administered 2019-09-15: 1

## 2019-09-15 MED ORDER — LACTATED RINGERS IV SOLN: INTRAVENOUS | Status: DC | PRN

## 2019-09-15 MED ORDER — FENTANYL CITRATE (PF) 100 MCG/2ML IJ SOLN
INTRAMUSCULAR | Status: DC | PRN
  Administered 2019-09-15 (×4): 50 ug via INTRAVENOUS
  Administered 2019-09-15: 100 ug via INTRAVENOUS
  Administered 2019-09-15 (×2): 50 ug via INTRAVENOUS

## 2019-09-15 MED ORDER — SODIUM CHLORIDE (PF) 0.9 % IJ SOLN
INTRAMUSCULAR | Status: AC
  Filled 2019-09-15: qty 10

## 2019-09-15 MED ORDER — PROPOFOL 10 MG/ML IV BOLUS
INTRAVENOUS | Status: DC | PRN
  Administered 2019-09-15: 200 mg via INTRAVENOUS

## 2019-09-15 MED ORDER — MENTHOL 3 MG MT LOZG: 1.0000 | LOZENGE | OROMUCOSAL | Status: DC | PRN

## 2019-09-15 MED ORDER — ROCURONIUM BROMIDE 10 MG/ML (PF) SYRINGE
PREFILLED_SYRINGE | INTRAVENOUS | Status: AC
  Filled 2019-09-15: qty 10

## 2019-09-15 MED ORDER — METHYLENE BLUE 0.5 % INJ SOLN
INTRAVENOUS | Status: AC
  Filled 2019-09-15: qty 10

## 2019-09-15 MED ORDER — ZOLPIDEM TARTRATE 5 MG PO TABS: 5.0000 mg | ORAL_TABLET | Freq: Every evening | ORAL | Status: DC | PRN

## 2019-09-15 MED ORDER — OXYCODONE HCL 5 MG PO TABS
5.0000 mg | ORAL_TABLET | ORAL | Status: DC | PRN
  Administered 2019-09-16: 10 mg via ORAL
  Filled 2019-09-15: qty 2
  Filled 2019-09-15: qty 1

## 2019-09-15 MED ORDER — ACETAMINOPHEN 500 MG PO TABS
1000.0000 mg | ORAL_TABLET | Freq: Four times a day (QID) | ORAL | Status: DC
  Administered 2019-09-15 – 2019-09-16 (×3): 1000 mg via ORAL
  Filled 2019-09-15 (×3): qty 2

## 2019-09-15 MED ORDER — ALUM & MAG HYDROXIDE-SIMETH 200-200-20 MG/5ML PO SUSP: 30.0000 mL | ORAL | Status: DC | PRN

## 2019-09-15 MED ORDER — SODIUM CHLORIDE 0.9 % IV SOLN: INTRAVENOUS | Status: DC | PRN

## 2019-09-15 MED ORDER — MIDAZOLAM HCL 5 MG/5ML IJ SOLN
INTRAMUSCULAR | Status: DC | PRN
  Administered 2019-09-15: 2 mg via INTRAVENOUS

## 2019-09-15 MED ORDER — PROMETHAZINE HCL 25 MG/ML IJ SOLN: 6.2500 mg | INTRAMUSCULAR | Status: DC | PRN

## 2019-09-15 MED ORDER — CELECOXIB 200 MG PO CAPS
400.0000 mg | ORAL_CAPSULE | ORAL | Status: AC
  Administered 2019-09-15: 11:00:00 400 mg via ORAL
  Filled 2019-09-15: qty 2

## 2019-09-15 MED ORDER — SODIUM CHLORIDE 0.9 % IV SOLN
2.0000 g | INTRAVENOUS | Status: AC
  Administered 2019-09-15: 2 g via INTRAVENOUS
  Filled 2019-09-15: qty 2

## 2019-09-15 MED ORDER — KETOROLAC TROMETHAMINE 30 MG/ML IJ SOLN
30.0000 mg | Freq: Once | INTRAMUSCULAR | Status: AC
  Administered 2019-09-15: 30 mg via INTRAVENOUS

## 2019-09-15 MED ORDER — SODIUM CHLORIDE 0.9% IV SOLUTION: Freq: Once | INTRAVENOUS | Status: DC

## 2019-09-15 MED ORDER — IBUPROFEN 400 MG PO TABS
800.0000 mg | ORAL_TABLET | Freq: Three times a day (TID) | ORAL | Status: DC
  Administered 2019-09-15 – 2019-09-16 (×3): 800 mg via ORAL
  Filled 2019-09-15 (×3): qty 2

## 2019-09-15 MED ORDER — SUGAMMADEX SODIUM 500 MG/5ML IV SOLN
INTRAVENOUS | Status: DC | PRN
  Administered 2019-09-15: 500 mg via INTRAVENOUS

## 2019-09-15 MED ORDER — STERILE WATER FOR IRRIGATION IR SOLN
Status: DC | PRN
  Administered 2019-09-15: 1000 mL

## 2019-09-15 MED ORDER — LIDOCAINE 2% (20 MG/ML) 5 ML SYRINGE
INTRAMUSCULAR | Status: DC | PRN
  Administered 2019-09-15: 80 mg via INTRAVENOUS
  Administered 2019-09-15: 1.5 mg/kg/h via INTRAVENOUS

## 2019-09-15 SURGICAL SUPPLY — 67 items
ADH SKN CLS APL DERMABOND .7 (GAUZE/BANDAGES/DRESSINGS) ×1
APL PRP STRL LF DISP 70% ISPRP (MISCELLANEOUS) ×1
APL SRG 38 LTWT LNG FL B (MISCELLANEOUS) ×1
APPLICATOR ARISTA FLEXITIP XL (MISCELLANEOUS) ×1 IMPLANT
BARRIER ADHS 3X4 INTERCEED (GAUZE/BANDAGES/DRESSINGS) IMPLANT
BRR ADH 4X3 ABS CNTRL BYND (GAUZE/BANDAGES/DRESSINGS)
CATH FOLEY 3WAY  5CC 16FR (CATHETERS) ×2
CATH FOLEY 3WAY 5CC 16FR (CATHETERS) ×1 IMPLANT
CELLS DAT CNTRL 66122 CELL SVR (MISCELLANEOUS) ×1 IMPLANT
CHLORAPREP W/TINT 26 (MISCELLANEOUS) ×2 IMPLANT
COVER BACK TABLE 60X90IN (DRAPES) ×2 IMPLANT
COVER TIP SHEARS 8 DVNC (MISCELLANEOUS) ×1 IMPLANT
COVER TIP SHEARS 8MM DA VINCI (MISCELLANEOUS) ×2
DECANTER SPIKE VIAL GLASS SM (MISCELLANEOUS) ×6 IMPLANT
DEFOGGER SCOPE WARMER CLEARIFY (MISCELLANEOUS) ×2 IMPLANT
DERMABOND ADVANCED (GAUZE/BANDAGES/DRESSINGS) ×1
DERMABOND ADVANCED .7 DNX12 (GAUZE/BANDAGES/DRESSINGS) ×1 IMPLANT
DILATOR CANAL MILEX (MISCELLANEOUS) ×2 IMPLANT
DRAPE ARM DVNC X/XI (DISPOSABLE) ×4 IMPLANT
DRAPE COLUMN DVNC XI (DISPOSABLE) ×1 IMPLANT
DRAPE DA VINCI XI ARM (DISPOSABLE) ×8
DRAPE DA VINCI XI COLUMN (DISPOSABLE) ×2
ELECT REM PT RETURN 15FT ADLT (MISCELLANEOUS) ×2 IMPLANT
GLOVE BIOGEL M 6.5 STRL (GLOVE) ×6 IMPLANT
GLOVE BIOGEL PI IND STRL 7.0 (GLOVE) ×5 IMPLANT
GLOVE BIOGEL PI INDICATOR 7.0 (GLOVE) ×5
HEMOSTAT ARISTA ABSORB 3G PWDR (HEMOSTASIS) ×1 IMPLANT
IRRIG SUCT STRYKERFLOW 2 WTIP (MISCELLANEOUS) ×2
IRRIGATION SUCT STRKRFLW 2 WTP (MISCELLANEOUS) ×1 IMPLANT
KIT TURNOVER KIT A (KITS) IMPLANT
LEGGING LITHOTOMY PAIR STRL (DRAPES) ×2 IMPLANT
NDL SPNL 22GX3.5 QUINCKE BK (NEEDLE) IMPLANT
NEEDLE SPNL 22GX3.5 QUINCKE BK (NEEDLE) ×2 IMPLANT
OBTURATOR OPTICAL STANDARD 8MM (TROCAR)
OBTURATOR OPTICAL STND 8 DVNC (TROCAR)
OBTURATOR OPTICALSTD 8 DVNC (TROCAR) IMPLANT
OCCLUDER COLPOPNEUMO (BALLOONS) ×2 IMPLANT
PACK ROBOT WH (CUSTOM PROCEDURE TRAY) ×2 IMPLANT
PACK ROBOTIC GOWN (GOWN DISPOSABLE) ×2 IMPLANT
PACK TRENDGUARD 450 HYBRID PRO (MISCELLANEOUS) IMPLANT
PAD PREP 24X48 CUFFED NSTRL (MISCELLANEOUS) ×2 IMPLANT
PENCIL SMOKE EVACUATOR (MISCELLANEOUS) IMPLANT
PROTECTOR NERVE ULNAR (MISCELLANEOUS) ×2 IMPLANT
RETRACTOR WND ALEXIS 18 MED (MISCELLANEOUS) IMPLANT
RTRCTR WOUND ALEXIS 18CM MED (MISCELLANEOUS) ×2
SEAL CANN UNIV 5-8 DVNC XI (MISCELLANEOUS) ×4 IMPLANT
SEAL XI 5MM-8MM UNIVERSAL (MISCELLANEOUS) ×8
SEALER VESSEL DA VINCI XI (MISCELLANEOUS) ×2
SEALER VESSEL EXT DVNC XI (MISCELLANEOUS) IMPLANT
SET CYSTO W/LG BORE CLAMP LF (SET/KITS/TRAYS/PACK) ×1 IMPLANT
SET TRI-LUMEN FLTR TB AIRSEAL (TUBING) IMPLANT
SUT VIC AB 0 CT1 27 (SUTURE) ×4
SUT VIC AB 0 CT1 27XBRD ANBCTR (SUTURE) ×2 IMPLANT
SUT VICRYL 0 UR6 27IN ABS (SUTURE) IMPLANT
SUT VICRYL RAPIDE 4/0 PS 2 (SUTURE) ×6 IMPLANT
SUT VLOC 180 0 9IN  GS21 (SUTURE) ×2
SUT VLOC 180 0 9IN GS21 (SUTURE) ×1 IMPLANT
TIP RUMI ORANGE 6.7MMX12CM (TIP) IMPLANT
TIP UTERINE 5.1X6CM LAV DISP (MISCELLANEOUS) IMPLANT
TIP UTERINE 6.7X10CM GRN DISP (MISCELLANEOUS) ×1 IMPLANT
TIP UTERINE 6.7X6CM WHT DISP (MISCELLANEOUS) IMPLANT
TIP UTERINE 6.7X8CM BLUE DISP (MISCELLANEOUS) IMPLANT
TOWEL OR 17X26 10 PK STRL BLUE (TOWEL DISPOSABLE) ×4 IMPLANT
TRENDGUARD 450 HYBRID PRO PACK (MISCELLANEOUS) ×2
TROCAR PORT AIRSEAL 8X120 (TROCAR) ×2 IMPLANT
WATER STERILE IRR 1000ML POUR (IV SOLUTION) ×2 IMPLANT
YANKAUER SUCT BULB TIP NO VENT (SUCTIONS) ×1 IMPLANT

## 2019-09-15 NOTE — Anesthesia Procedure Notes (Signed)
Procedure Name: Intubation Date/Time: 09/15/2019 1:00 PM Performed by: Lavina Hamman, CRNA Pre-anesthesia Checklist: Patient identified, Emergency Drugs available, Suction available, Patient being monitored and Timeout performed Patient Re-evaluated:Patient Re-evaluated prior to induction Oxygen Delivery Method: Circle system utilized Preoxygenation: Pre-oxygenation with 100% oxygen Induction Type: IV induction Ventilation: Mask ventilation without difficulty Laryngoscope Size: Mac and 3 Grade View: Grade II Tube type: Oral Tube size: 7.5 mm Number of attempts: 1 Airway Equipment and Method: Stylet Placement Confirmation: ETT inserted through vocal cords under direct vision,  positive ETCO2,  CO2 detector and breath sounds checked- equal and bilateral Secured at: 22 cm Tube secured with: Tape Dental Injury: Teeth and Oropharynx as per pre-operative assessment  Comments: ATOI

## 2019-09-15 NOTE — Anesthesia Preprocedure Evaluation (Addendum)
Anesthesia Evaluation  Patient identified by MRN, date of birth, ID band Patient awake    Reviewed: Allergy & Precautions, NPO status , Patient's Chart, lab work & pertinent test results  History of Anesthesia Complications Negative for: history of anesthetic complications  Airway Mallampati: II  TM Distance: >3 FB Neck ROM: Full    Dental no notable dental hx. (+) Dental Advisory Given   Pulmonary neg pulmonary ROS,    Pulmonary exam normal        Cardiovascular hypertension, Pt. on medications Normal cardiovascular exam  Conclusion  Angiographically normal coronary arteries with no significant coronary artery disease.  Troponin assay drawn in the ED was positive at 0.13 ng per mL.  However, further troponin assay were negative. Positive troponin may have been an outlier.  Given patient's chest pain without significant ischemic changes, this could have been an event of coronary vasospasm lead to vasovagal syncope.  Given patient's significant menorrhagia, do not recommend dual antiplatelet therapy.  Recommend statin and amlodipine as vasodilator therapy.  Study Conclusions   - Left ventricle: The cavity size was normal. Wall thickness was  normal. Systolic function was normal. The estimated ejection  fraction was in the range of 55% to 60%. Wall motion was normal;  there were no regional wall motion abnormalities. Left  ventricular diastolic function parameters were normal.  - Tricuspid valve: There was mild regurgitation.  - Pulmonary arteries: PA peak pressure: 28 mm Hg (S).     Neuro/Psych negative neurological ROS     GI/Hepatic Neg liver ROS, GERD  Medicated,  Endo/Other  Morbid obesity  Renal/GU negative Renal ROS     Musculoskeletal negative musculoskeletal ROS (+)   Abdominal   Peds  Hematology negative hematology ROS (+)   Anesthesia Other Findings Day of surgery medications reviewed  with the patient.  Reproductive/Obstetrics                            Anesthesia Physical Anesthesia Plan  ASA: III  Anesthesia Plan: General   Post-op Pain Management:    Induction: Intravenous  PONV Risk Score and Plan: 4 or greater and Ondansetron, Dexamethasone and Scopolamine patch - Pre-op  Airway Management Planned: Oral ETT  Additional Equipment:   Intra-op Plan:   Post-operative Plan: Extubation in OR  Informed Consent: I have reviewed the patients History and Physical, chart, labs and discussed the procedure including the risks, benefits and alternatives for the proposed anesthesia with the patient or authorized representative who has indicated his/her understanding and acceptance.     Dental advisory given  Plan Discussed with: Anesthesiologist and CRNA  Anesthesia Plan Comments:        Anesthesia Quick Evaluation

## 2019-09-15 NOTE — Anesthesia Postprocedure Evaluation (Signed)
Anesthesia Post Note  Patient: Marissa Flores  Procedure(s) Performed: XI ROBOTIC ASSISTED LAPAROSCOPIC HYSTERECTOMY AND SALPINGECTOMY (Bilateral )     Patient location during evaluation: PACU Anesthesia Type: General Level of consciousness: sedated Pain management: pain level controlled Vital Signs Assessment: post-procedure vital signs reviewed and stable Respiratory status: spontaneous breathing and respiratory function stable Cardiovascular status: stable Postop Assessment: no apparent nausea or vomiting Anesthetic complications: no    Last Vitals:  Vitals:   09/15/19 1915 09/15/19 1945  BP: (!) 157/84 136/80  Pulse: 95 93  Resp: (!) 23   Temp:  (!) 36.3 C  SpO2: 97% 100%    Last Pain:  Vitals:   09/15/19 1915  TempSrc:   PainSc: [place]

## 2019-09-15 NOTE — Op Note (Signed)
09/15/2019  5:59 PM  PATIENT:  Marissa Flores  44 y.o. female  PRE-OPERATIVE DIAGNOSIS:  D25.9 Fibroids N92.0 Menorrhagia with regular cycle 3 severe anemia   POST-OPERATIVE DIAGNOSIS:  D25.9 Fibroids N92.0 Menorrhagia with regular cycle 3 severe anemia   PROCEDURE:  Procedure(s) with comments: XI ROBOTIC ASSISTED LAPAROSCOPIC HYSTERECTOMY AND SALPINGECTOMY (Bilateral) - Tracie to RNFA confirmed on 07/26/19 CS  SURGEON:  Surgeon(s) and Role:    Christophe Louis, MD - Primary    * Janyth Pupa, DO - Assisting  PHYSICIAN ASSISTANT: None   ASSISTANTS: Gaylord Shih RNFA   ANESTHESIA:   general  EBL:  100 mL   BLOOD ADMINISTERED:none  DRAINS: none   LOCAL MEDICATIONS USED:  OTHER Robivicaine   SPECIMEN:  Source of Specimen:  uterus cervix and bilateral fallopian tubes   DISPOSITION OF SPECIMEN:  PATHOLOGY  COUNTS:  YES  TOURNIQUET:  * No tourniquets in log *  DICTATION: .Dragon Dictation  PLAN OF CARE: Admit for overnight observation  PATIENT DISPOSITION:  PACU - hemodynamically stable.   Delay start of Pharmacological VTE agent (>24hrs) due to surgical blood loss or risk of bleeding: not applicable  Findings: dense adhesions of the omentum to the anterior abdominal wall. Enlarged uterus with multiple fibroids. Fibroid located in the right broad ligament. Normal ovaries bilaterally . Normal appearing fallopian tubes.   Procedure: The patient was taken to operating room #2 where she was placed under general anesthesia.Time out was performed. Marland Kitchen She was placed in dorsal lithotomy position and prepped and draped in the usual sterile fashion. A weighted speculum was placed into the vagina. A Deaver was placed anteriorly for retraction. The anterior lip of the cervix was grasped with a single-tooth tenaculum. The vaginal mucosa was injected with 2.5 cc of ropivacaine at the 2/4/ 8 and 10 o'clock positions. The uterus was sounded to 12cm. the cervix was dilated to 6 mm . 0 vicryl  suture placed at the 12 and 6:00 positions Of the cervix to facilitate placement of a Ru mi uterine manipulator. The manipulator was placed without difficulty. Weighted speculum and Deaver were removed .  Attention was turned to the patient's abdomen where a 8 mm trocar was placed 2 cm above the umbilicus. under direct visualization . The pneumoperitoneum was achieved with PCO2 gas. The laparoscope was removed. 60 cc of ropivacaine were injected into the abdominal cavity. The laparoscope was reinserted. An 8 mm trocar was placed in the right upper quadrant 16 centimeters from the umbilicus.later connected to robotic arm #4). An 8MM incision was made in the Right upper quadrant TROCAR WAS PLACED 8 cm from the umbilicus. Later connected to robotic arm #3. An 8 mm incision was made in the left upper quadrant 16 cm from the umbilicus and connected to robot arm #1. Marland Kitchen Attention was turned to the left upper quadrant where a 8 mm midclavicular assistant trocar was placed. ( All incision sites were injected with 10cc of ropivacaine prior to port placement. )  Once all ports had been placed under direct visualization.The laparoscope was removed and the [place] robotic system was thin right-sided docked. The robotic arms were connected to the corresponding trocars as listed above. The laparoscope was then reinserted. The long tip bipolar forceps were placed into port #1. The monopolar scissor placed in the port #4. A vessel sealerwas placed in port #3. All instruments were directed into the pelvis under direct visualization.  Attention was turned to the surgeons console The adhesions of  the omentum were excised from the anterior abdominal wall. .. The left mesosalpinx and left utero-ovarian ligament was cauterized and transected with the vessel sealer The broad ligament was cauterized and transected with the vessel sealer .The round ligament was cauterized and transected with the vessel sealer  The anterior leaf of broad  ligament was incised along the bladder reflection to the midline.  The right  mesosalpinx and right utero-ovarian ligament was cauterized and transected with the vessel sealer. The right broad ligament was cauterized and transected with the vessel sealer. The right round ligament was cauterized and transected with the vessel sealer The broad ligament was incised to the midline. The bladder was dissected off the lower uterine segments of the cervix via sharp and blunt dissection.   The uterine arteries were skeleton bilaterally. They were  cauterized and transected with the vessel sealer The KOH ring was identified. The anterior colpotomy was performed followed by the posterior colpotomy. Once the uterus,cervix and bilateral fallopian tubes were completely excised the robotic hook was used to bivalve the uterus . An alexis retractor was placed into the abdomen vaginally .  The uterus  was  Them removed through the vagina. The  bipolar forceps and hook  were removed and log tip forceps were placed in the port #1 and the cutting needle driver was placed in to port #3.  The vaginal cuff was closed with running suture if 0 v-lock. The pelvis was irrigated. Marland KitchenMarland KitchenMarland KitchenExcellent hemostasis was noted. Arista was placed along the vaginal cuff.  All pelvic pedicles were examined and hemostasis was noted.  All instruments removed from the ports. All ports were removed under direct Visualization. The pneumoperitoneum was released. The skin incisions were closed with 4-0 Vicryl and then covered with Derma bond.     Sponge lap and needle counts weIre correct x. The patient was awakened from anesthesia and taken to the recovery room in stable condition.

## 2019-09-15 NOTE — H&P (Signed)
Date of Initial H&P: 04/15/2020  History reviewed, patient examined,stable for surgery.  HGb 7.1 plan to transfuse  2 units of prbc's pt in agreement .

## 2019-09-15 NOTE — Transfer of Care (Signed)
Immediate Anesthesia Transfer of Care Note  Patient: Marissa Flores  Procedure(s) Performed: XI ROBOTIC ASSISTED LAPAROSCOPIC HYSTERECTOMY AND SALPINGECTOMY (Bilateral )  Patient Location: PACU  Anesthesia Type:General  Level of Consciousness: awake and patient cooperative  Airway & Oxygen Therapy: Patient Spontanous Breathing and Patient connected to face mask  Post-op Assessment: Report given to RN and Post -op Vital signs reviewed and stable  Post vital signs: Reviewed and stable  Last Vitals:  Vitals Value Taken Time  BP 129/71 09/15/19 1746  Temp    Pulse 91 09/15/19 1748  Resp 27 09/15/19 1748  SpO2 96 % 09/15/19 1748  Vitals shown include unvalidated device data.  Last Pain:  Vitals:   09/15/19 1040  TempSrc:   PainSc: 0-No pain         Complications: No apparent anesthesia complications

## 2019-09-16 DIAGNOSIS — D251 Intramural leiomyoma of uterus: Secondary | ICD-10-CM | POA: Diagnosis not present

## 2019-09-16 LAB — CBC
HCT: 32.5 % — ABNORMAL LOW (ref 36.0–46.0)
Hemoglobin: 9.2 g/dL — ABNORMAL LOW (ref 12.0–15.0)
MCH: 20.5 pg — ABNORMAL LOW (ref 26.0–34.0)
MCHC: 28.3 g/dL — ABNORMAL LOW (ref 30.0–36.0)
MCV: 72.4 fL — ABNORMAL LOW (ref 80.0–100.0)
Platelets: 279 10*3/uL (ref 150–400)
RBC: 4.49 MIL/uL (ref 3.87–5.11)
RDW: 21.4 % — ABNORMAL HIGH (ref 11.5–15.5)
WBC: 16.1 10*3/uL — ABNORMAL HIGH (ref 4.0–10.5)
nRBC: 0 % (ref 0.0–0.2)

## 2019-09-16 MED ORDER — OXYCODONE HCL 5 MG PO TABS: 5.0000 mg | ORAL_TABLET | Freq: Four times a day (QID) | ORAL | 0 refills | Status: AC | PRN

## 2019-09-16 MED ORDER — IBUPROFEN 800 MG PO TABS: 800.0000 mg | ORAL_TABLET | Freq: Three times a day (TID) | ORAL | 0 refills | Status: AC | PRN

## 2019-09-16 MED ORDER — ACETAMINOPHEN 500 MG PO TABS: 1000.0000 mg | ORAL_TABLET | Freq: Three times a day (TID) | ORAL | 0 refills | Status: AC | PRN

## 2019-09-16 NOTE — Progress Notes (Signed)
Discharge instructions discussed with patient and family, verbalized agreement and understanding 

## 2019-09-17 LAB — SURGICAL PATHOLOGY

## 2019-09-19 LAB — TYPE AND SCREEN
ABO/RH(D): B POS
Antibody Screen: NEGATIVE
Unit division: 0
Unit division: 0
Unit division: 0
Unit division: 0

## 2019-09-19 LAB — BPAM RBC
Blood Product Expiration Date: 202106052359
Blood Product Expiration Date: 202106102359
Blood Product Expiration Date: 202106102359
Blood Product Expiration Date: 202106102359
ISSUE DATE / TIME: 202105121239
ISSUE DATE / TIME: 202105121239
Unit Type and Rh: 7300
Unit Type and Rh: 7300
Unit Type and Rh: 7300
Unit Type and Rh: 7300

## 2019-10-13 NOTE — Discharge Summary (Signed)
Physician Discharge Summary  Patient ID: Marissa Flores MRN: [id number] DOB/AGE: May 23, 1975 44 y.o.  Admit date: 09/15/2019 Discharge date:09/16/2019 Admission Diagnoses:Fibroids and menorrhagia   Discharge Diagnoses:  Active Problems:   S/P hysterectomy   Discharged Condition: stable  Hospital Course: pt was admitted for observation after undergoing a robotic assisted laparoscopic hysterectomy with bilateral salpingectomy she did well postoperatively with return of bowel and bladder function.  Consults: None  Significant Diagnostic Studies: labs: hgb on pod #1 was 9.2  Treatments: surgery: robotic assisted laparoscopic hysterectomy with bilateral salpingectomy   Discharge Exam: Blood pressure 111/60, pulse 99, temperature 98.5 F (36.9 C), resp. rate 17, last menstrual period 08/20/2019, SpO2 100 %. General appearance: alert, cooperative and no distress GI: abnormal findings:  non.Marland Kitchen abdomen is soft appropriately tender +BS Extremities: extremities normal, atraumatic, no cyanosis or edema  Disposition: Discharge disposition: 01-Home or Self Care       Discharge Instructions    Call MD for:  persistant nausea and vomiting   Complete by: As directed    Call MD for:  redness, tenderness, or signs of infection (pain, swelling, redness, odor or green/yellow discharge around incision site)   Complete by: As directed    Call MD for:  severe uncontrolled pain   Complete by: As directed    Call MD for:  temperature >100.4   Complete by: As directed    Diet - low sodium heart healthy   Complete by: As directed    Diet - low sodium heart healthy   Complete by: As directed    Driving Restrictions   Complete by: As directed    Void driving for 1 week   Increase activity slowly   Complete by: As directed    Increase activity slowly   Complete by: As directed    Lifting restrictions   Complete by: As directed    Avoid lifting over 10 lbs   May shower / Bathe   Complete by:  As directed    May walk up steps   Complete by: As directed    No wound care   Complete by: As directed    Sexual Activity Restrictions   Complete by: As directed    Avoid sex for 6 weeks     Allergies as of 09/16/2019   No Known Allergies     Medication List    STOP taking these medications   rosuvastatin 10 MG tablet Commonly known as: CRESTOR     TAKE these medications   acetaminophen 500 MG tablet Commonly known as: TYLENOL Take 2 tablets (1,000 mg total) by mouth every 8 (eight) hours as needed.   amLODipine 5 MG tablet Commonly known as: NORVASC TAKE 1 TABLET BY MOUTH EVERY DAY   ibuprofen 800 MG tablet Commonly known as: ADVIL Take 1 tablet (800 mg total) by mouth every 8 (eight) hours as needed. What changed:   medication strength  how much to take  when to take this  reasons to take this   omeprazole 20 MG capsule Commonly known as: PRILOSEC Take 20 mg by mouth every morning.     ASK your doctor about these medications   oxyCODONE 5 MG immediate release tablet Commonly known as: Oxy IR/ROXICODONE Take 1-2 tablets (5-10 mg total) by mouth every 6 (six) hours as needed for up to 7 days for moderate pain or severe pain. Ask about: Should I take this medication?      Follow-up Information    Christophe Louis, MD.  Go in 2 week(s).   Specialty: Obstetrics and Gynecology Contact information: [address] [place] [address] [phone]           Signed: Christophe Louis 10/13/2019, 11:40 AM
